# Patient Record
Sex: Male | Born: 1980 | Race: Black or African American | Hispanic: No | Marital: Single | Smoking: Never smoker
Health system: Southern US, Community
[De-identification: ages and names within clinical notes are randomized; demographics above are authoritative.]

## PROBLEM LIST (undated history)

## (undated) DIAGNOSIS — Z789 Other specified health status: Secondary | ICD-10-CM

## (undated) HISTORY — DX: Other specified health status: Z78.9

## (undated) HISTORY — PX: NO PAST SURGERIES: SHX2092

---

## 2019-02-11 ENCOUNTER — Emergency Department (HOSPITAL_COMMUNITY): Payer: PRIVATE HEALTH INSURANCE

## 2019-02-11 ENCOUNTER — Emergency Department (HOSPITAL_COMMUNITY)
Admission: EM | Admit: 2019-02-11 | Discharge: 2019-02-11 | Disposition: A | Discharge status: Home / Self Care | Payer: PRIVATE HEALTH INSURANCE | Attending: Emergency Medicine | Admitting: Emergency Medicine

## 2019-02-11 ENCOUNTER — Encounter (HOSPITAL_COMMUNITY): Payer: Self-pay | Admitting: Emergency Medicine

## 2019-02-11 ENCOUNTER — Other Ambulatory Visit: Payer: Self-pay

## 2019-02-11 DIAGNOSIS — I10 Essential (primary) hypertension: Secondary | ICD-10-CM | POA: Insufficient documentation

## 2019-02-11 DIAGNOSIS — K219 Gastro-esophageal reflux disease without esophagitis: Secondary | ICD-10-CM | POA: Insufficient documentation

## 2019-02-11 DIAGNOSIS — F419 Anxiety disorder, unspecified: Secondary | ICD-10-CM | POA: Insufficient documentation

## 2019-02-11 LAB — COMPREHENSIVE METABOLIC PANEL
ALT: 25 U/L (ref 0–44)
AST: 26 U/L (ref 15–41)
Albumin: 4.5 g/dL (ref 3.5–5.0)
Alkaline Phosphatase: 74 U/L (ref 38–126)
Anion gap: 12 (ref 5–15)
BUN: 9 mg/dL (ref 6–20)
CO2: 23 mmol/L (ref 22–32)
Calcium: 9.8 mg/dL (ref 8.9–10.3)
Chloride: 106 mmol/L (ref 98–111)
Creatinine, Ser: 1.1 mg/dL (ref 0.61–1.24)
GFR calc Af Amer: >60 mL/min (ref >60)
GFR calc non Af Amer: >60 mL/min (ref >60)
Glucose, Bld: 100 mg/dL — ABNORMAL HIGH (ref 70–99)
Potassium: 3.5 mmol/L (ref 3.5–5.1)
Sodium: 141 mmol/L (ref 135–145)
Total Bilirubin: 0.9 mg/dL (ref 0.3–1.2)
Total Protein: 7.8 g/dL (ref 6.5–8.1)

## 2019-02-11 LAB — CBC
HCT: 47 % (ref 39.0–52.0)
Hemoglobin: 16 g/dL (ref 13.0–17.0)
MCH: 31.5 pg (ref 26.0–34.0)
MCHC: 34 g/dL (ref 30.0–36.0)
MCV: 92.5 fL (ref 80.0–100.0)
Platelets: 279 10*3/uL (ref 150–400)
RBC: 5.08 MIL/uL (ref 4.22–5.81)
RDW: 11.4 % — ABNORMAL LOW (ref 11.5–15.5)
WBC: 5.5 10*3/uL (ref 4.0–10.5)
nRBC: 0 % (ref 0.0–0.2)

## 2019-02-11 LAB — TROPONIN I: Troponin I: <0.03 ng/mL (ref <0.03)

## 2019-02-11 LAB — T4, FREE: Free T4: 0.79 ng/dL — ABNORMAL LOW (ref 0.82–1.77)

## 2019-02-11 LAB — TSH: TSH: 0.76 u[IU]/mL (ref 0.350–4.500)

## 2019-02-11 MED ORDER — OMEPRAZOLE 20 MG PO CPDR: 20.0000 mg | DELAYED_RELEASE_CAPSULE | Freq: Every day | ORAL | 0 refills | Status: DC

## 2019-02-11 MED ORDER — ALUM & MAG HYDROXIDE-SIMETH 200-200-20 MG/5ML PO SUSP: 30.0000 mL | Freq: Once | ORAL | Status: DC

## 2019-02-11 NOTE — ED Notes (Signed)
Patient verbalizes understanding of discharge instructions. Opportunity for questioning and answers were provided. Armband removed by staff, pt discharged from ED.  

## 2019-02-11 NOTE — ED Triage Notes (Signed)
p stated, its a real tightness and Im really scared.

## 2019-02-11 NOTE — ED Triage Notes (Signed)
Pt. At sort in tears and stated, Im not sure if its anxiety, acid reflux . I know I ate a taco from a truck and I had a type of indigestion and bubbling in my stomach and going up in my chest.

## 2019-02-11 NOTE — ED Provider Notes (Signed)
[facility] EMERGENCY DEPARTMENT Provider Note   CSN: [id number] Arrival date & time: 02/11/19  1258    History   Chief Complaint Chief Complaint  Patient presents with  . Anxiety  . Gastroesophageal Reflux    HPI Mario Li is a 38 y.o. male.     HPI Pt ate a taco on Friday.   After that he started feeling a tightness in his throat.  He also started burping a lot.   No CP or abd pain.  Pt has been able to drink fluids.  No difficulty swallowing.   He is very anxious about this.   Patient is concerned because he is not sure what is going on.  He is never had this problem before.  He is worried that it could be something like covid or his thyroid. History reviewed. No pertinent past medical history.  There are no active problems to display for this patient.   History reviewed. No pertinent surgical history.      Home Medications    Prior to Admission medications   Medication Sig Start Date End Date Taking? Authorizing Provider  omeprazole (PRILOSEC) 20 MG capsule Take 1 capsule (20 mg total) by mouth daily. 02/11/19   Linwood Dibbles, MD    Family History No family history on file.  Social History Social History   Tobacco Use  . Smoking status: Never Smoker  . Smokeless tobacco: Never Used  Substance Use Topics  . Alcohol use: Not Currently  . Drug use: Yes    Types: Marijuana     Allergies   Patient has no allergy information on record.   Review of Systems Review of Systems  All other systems reviewed and are negative.    Physical Exam Updated Vital Signs BP (!) 168/103   Pulse 87   Temp 98.2 F (36.8 C) (Oral)   Resp (!) 22   Ht 1.676 m (5\' 6" )   Wt 89.8 kg   SpO2 97%   BMI 31.96 kg/m   Physical Exam Vitals signs and nursing note reviewed.  Constitutional:      General: He is not in acute distress.    Appearance: He is well-developed.  HENT:     Head: Normocephalic and atraumatic.     Right Ear: External ear  normal.     Left Ear: External ear normal.  Eyes:     General: No scleral icterus.       Right eye: No discharge.        Left eye: No discharge.     Conjunctiva/sclera: Conjunctivae normal.  Neck:     Musculoskeletal: Neck supple.     Trachea: No tracheal deviation.  Cardiovascular:     Rate and Rhythm: Normal rate and regular rhythm.  Pulmonary:     Effort: Pulmonary effort is normal. No respiratory distress.     Breath sounds: Normal breath sounds. No stridor. No wheezing or rales.  Abdominal:     General: Bowel sounds are normal. There is no distension.     Palpations: Abdomen is soft.     Tenderness: There is no abdominal tenderness. There is no guarding or rebound.  Musculoskeletal:        General: No tenderness.  Skin:    General: Skin is warm and dry.     Findings: No rash.  Neurological:     Mental Status: He is alert.     Cranial Nerves: No cranial nerve deficit (no facial droop, extraocular movements intact,  no slurred speech).     Sensory: No sensory deficit.     Motor: No abnormal muscle tone or seizure activity.     Coordination: Coordination normal.      ED Treatments / Results  Labs (all labs ordered are listed, but only abnormal results are displayed) Labs Reviewed  CBC - Abnormal; Notable for the following components:      Result Value   RDW 11.4 (*)    All other components within normal limits  COMPREHENSIVE METABOLIC PANEL - Abnormal; Notable for the following components:   Glucose, Bld 100 (*)    All other components within normal limits  T4, FREE - Abnormal; Notable for the following components:   Free T4 0.79 (*)    All other components within normal limits  TROPONIN I  TSH    EKG EKG Interpretation  Date/Time:  Saturday Feb 11 2019 14:01:00 EDT Ventricular Rate:  83 PR Interval:    QRS Duration: 88 QT Interval:  362 QTC Calculation: 426 R Axis:   66 Text Interpretation:  Sinus rhythm Borderline T wave abnormalities No old tracing to  compare Confirmed by Linwood Dibbles [phone]) on 02/11/2019 2:14:41 PM Also confirmed by Linwood Dibbles [phone]), editor Barbette Hair [phone])  on 02/11/2019 2:36:02 PM   Radiology Dg Chest Portable 1 View  Result Date: 02/11/2019 CLINICAL DATA:  Throat discomfort. EXAM: PORTABLE CHEST 1 VIEW COMPARISON:  None. FINDINGS: The heart size and mediastinal contours are within normal limits. Both lungs are clear. The visualized skeletal structures are unremarkable. IMPRESSION: No active disease. Electronically Signed   By: Gerome Sam III M.D   On: 02/11/2019 14:06    Procedures Procedures (including critical care time)  Medications Ordered in ED Medications  alum & mag hydroxide-simeth (MAALOX/MYLANTA) 200-200-20 MG/5ML suspension 30 mL (has no administration in time range)     Initial Impression / Assessment and Plan / ED Course  I have reviewed the triage vital signs and the nursing notes.  Pertinent labs & imaging results that were available during my care of the patient were reviewed by me and considered in my medical decision making (see chart for details).  Clinical Course as of Feb 11 1527  Sat Feb 11, 2019  1500 T4 is slightly lower than normal.  TSH is normal.  Labs otherwise normal   [JK]  1525 BP 140/90 at the bedside   [JK]    Clinical Course User Index [JK] Linwood Dibbles, MD     Patient's ED work-up is reassuring.  Laboratory tests are unremarkable.  EKG is reassuring.  No findings to suggest cardiac etiology.  Patient does not have any difficulty handling his secretions.  I doubt esophageal impaction.  Thyroid tests are unremarkable.  I suspect the patient is having issues with gastroesophageal reflux.  This is likely giving him this globus hystericus sensation.  He is having a component and anxiety but I think this was triggered by his esophagitis.  Plan on discharge home with antacids.  Recommend following up with primary care doctor to recheck his blood pressure which is most likely  secondary to his discomfort and to see how his presumed esophagitis is responding to treatment.  Final Clinical Impressions(s) / ED Diagnoses   Final diagnoses:  Gastroesophageal reflux disease, esophagitis presence not specified  Anxiety  Hypertension, unspecified type    ED Discharge Orders         Ordered    omeprazole (PRILOSEC) 20 MG capsule  Daily  02/11/19 1522           Linwood DibblesKnapp, Zori Benbrook, MD 02/11/19 1530

## 2019-02-11 NOTE — Discharge Instructions (Signed)
Take the antacid medications as prescribed, follow-up with your primary care doctor to be rechecked next week.  Return as needed for worsening symptoms.

## 2019-02-11 NOTE — ED Notes (Signed)
Pt has been in contact with family. 

## 2019-02-21 NOTE — Progress Notes (Signed)
Patient ID: Mario Li, male   DOB: [birth year], 38 y.o.   MRN: [id number]  Virtual Visit via Telephone Note  I connected with Mario Li on 02/22/19 at  1:50 PM EDT by telephone and verified that I am speaking with the correct person using two identifiers.   I discussed the limitations, risks, security and privacy concerns of performing an evaluation and management service by telephone and the availability of in person appointments. I also discussed with the patient that there may be a patient responsible charge related to this service. The patient expressed understanding and agreed to proceed.  Patient location:  home My Location:  [facility] office Persons on the call:  Myself and the patient   History of Present Illness: After being seen in the ED 02/11/2019 for globus/panic sensation  Cardiac w/up benign.   Believed to be reflux and treated with Omeprazole.  Patient does have anxiety in large crowds.  Omeprazole upset his stomach, so, he isn't taking it.  He has never been on medication for anxiety but feels he has always had some issues with anxiety on and off.  Denies depression.  No SI/HI.  Checking bp at home.  116/81.  It was high in the ED.    From ED A/P: Patient's ED work-up is reassuring.  Laboratory tests are unremarkable.  EKG is reassuring.  No findings to suggest cardiac etiology.  Patient does not have any difficulty handling his secretions.  I doubt esophageal impaction.  Thyroid tests are unremarkable.  I suspect the patient is having issues with gastroesophageal reflux.  This is likely giving him this globus hystericus sensation.  He is having a component and anxiety but I think this was triggered by his esophagitis.  Plan on discharge home with antacids.  Recommend following up with primary care doctor to recheck his blood pressure which is most likely secondary to his discomfort and to see how his presumed esophagitis is responding to treatment.    Observations/Objective:  A&Ox3.  NAD.  TP linear.  Speech is clear.    Assessment and Plan: 1. Gastroesophageal reflux disease without esophagitis GERD prevention reviewed  2. Elevated BP without diagnosis of hypertension Normotensive at home.  Continue to check BP at home - Lipid panel; Future  3. Encounter for examination following treatment at hospital Much improved.  Symptoms resolved currently  4. Anxiety -consider SSRI or buspar.  He wishes to defer for now.  Deep breathing techniques, self-care, etc discussed at length -will have Jasmine f/up with him - Vitamin D, 25-hydroxy; Future    Follow Up Instructions: Labs Friday Assign PCP in 1 month Jasmine to follow- up.    I discussed the assessment and treatment plan with the patient. The patient was provided an opportunity to ask questions and all were answered. The patient agreed with the plan and demonstrated an understanding of the instructions.   The patient was advised to call back or seek an in-person evaluation if the symptoms worsen or if the condition fails to improve as anticipated.  I provided 24 minutes of non-face-to-face time during this encounter.   Georgian Co, PA-C

## 2019-02-22 ENCOUNTER — Ambulatory Visit: Payer: No Typology Code available for payment source | Attending: Family Medicine | Admitting: Physician Assistant

## 2019-02-22 ENCOUNTER — Other Ambulatory Visit: Payer: Self-pay

## 2019-02-22 DIAGNOSIS — K219 Gastro-esophageal reflux disease without esophagitis: Secondary | ICD-10-CM

## 2019-02-22 DIAGNOSIS — R030 Elevated blood-pressure reading, without diagnosis of hypertension: Secondary | ICD-10-CM

## 2019-02-22 DIAGNOSIS — F419 Anxiety disorder, unspecified: Secondary | ICD-10-CM

## 2019-02-22 DIAGNOSIS — Z09 Encounter for follow-up examination after completed treatment for conditions other than malignant neoplasm: Secondary | ICD-10-CM

## 2019-02-24 ENCOUNTER — Ambulatory Visit: Payer: No Typology Code available for payment source | Attending: Family Medicine

## 2019-02-24 ENCOUNTER — Other Ambulatory Visit: Payer: Self-pay

## 2019-02-24 DIAGNOSIS — F419 Anxiety disorder, unspecified: Secondary | ICD-10-CM

## 2019-02-24 DIAGNOSIS — R030 Elevated blood-pressure reading, without diagnosis of hypertension: Secondary | ICD-10-CM

## 2019-02-25 LAB — LIPID PANEL
Chol/HDL Ratio: 3 ratio (ref 0.0–5.0)
Cholesterol, Total: 133 mg/dL (ref 100–199)
HDL: 45 mg/dL (ref >39)
LDL Calculated: 73 mg/dL (ref 0–99)
Triglycerides: 76 mg/dL (ref 0–149)
VLDL Cholesterol Cal: 15 mg/dL (ref 5–40)

## 2019-02-25 LAB — VITAMIN D 25 HYDROXY (VIT D DEFICIENCY, FRACTURES): Vit D, 25-Hydroxy: 20.7 ng/mL — ABNORMAL LOW (ref 30.0–100.0)

## 2019-02-26 ENCOUNTER — Other Ambulatory Visit: Payer: Self-pay | Admitting: Physician Assistant

## 2019-02-26 MED ORDER — VITAMIN D (ERGOCALCIFEROL) 1.25 MG (50000 UNIT) PO CAPS: 50000.0000 [IU] | ORAL_CAPSULE | ORAL | 0 refills | Status: DC

## 2019-02-27 ENCOUNTER — Telehealth: Payer: Self-pay | Admitting: Emergency Medicine

## 2019-02-27 NOTE — Telephone Encounter (Signed)
Patient contacted via phone to be given results of labs.  Patient identified by name and date of birth.  Patient given results of labs.  Patient educated on lab results. Questions answered. Patient acknowledged understanding of labs results. 

## 2019-03-09 ENCOUNTER — Other Ambulatory Visit: Payer: Self-pay

## 2019-03-09 ENCOUNTER — Encounter (HOSPITAL_COMMUNITY): Payer: Self-pay | Admitting: Emergency Medicine

## 2019-03-09 ENCOUNTER — Emergency Department (HOSPITAL_COMMUNITY)
Admission: EM | Admit: 2019-03-09 | Discharge: 2019-03-09 | Disposition: A | Discharge status: Home / Self Care | Payer: PRIVATE HEALTH INSURANCE | Attending: Emergency Medicine | Admitting: Emergency Medicine

## 2019-03-09 ENCOUNTER — Telehealth: Payer: No Typology Code available for payment source

## 2019-03-09 ENCOUNTER — Emergency Department (HOSPITAL_COMMUNITY): Payer: PRIVATE HEALTH INSURANCE

## 2019-03-09 DIAGNOSIS — R002 Palpitations: Secondary | ICD-10-CM

## 2019-03-09 DIAGNOSIS — F1290 Cannabis use, unspecified, uncomplicated: Secondary | ICD-10-CM | POA: Insufficient documentation

## 2019-03-09 DIAGNOSIS — R072 Precordial pain: Secondary | ICD-10-CM | POA: Insufficient documentation

## 2019-03-09 DIAGNOSIS — R0789 Other chest pain: Secondary | ICD-10-CM | POA: Diagnosis present

## 2019-03-09 LAB — BASIC METABOLIC PANEL
Anion gap: 12 (ref 5–15)
BUN: 10 mg/dL (ref 6–20)
CO2: 20 mmol/L — ABNORMAL LOW (ref 22–32)
Calcium: 9.6 mg/dL (ref 8.9–10.3)
Chloride: 106 mmol/L (ref 98–111)
Creatinine, Ser: 1.09 mg/dL (ref 0.61–1.24)
GFR calc Af Amer: >60 mL/min (ref >60)
GFR calc non Af Amer: >60 mL/min (ref >60)
Glucose, Bld: 106 mg/dL — ABNORMAL HIGH (ref 70–99)
Potassium: 3.5 mmol/L (ref 3.5–5.1)
Sodium: 138 mmol/L (ref 135–145)

## 2019-03-09 LAB — CBC
HCT: 48.1 % (ref 39.0–52.0)
Hemoglobin: 16.3 g/dL (ref 13.0–17.0)
MCH: 31.7 pg (ref 26.0–34.0)
MCHC: 33.9 g/dL (ref 30.0–36.0)
MCV: 93.6 fL (ref 80.0–100.0)
Platelets: 294 10*3/uL (ref 150–400)
RBC: 5.14 MIL/uL (ref 4.22–5.81)
RDW: 11.3 % — ABNORMAL LOW (ref 11.5–15.5)
WBC: 6 10*3/uL (ref 4.0–10.5)
nRBC: 0 % (ref 0.0–0.2)

## 2019-03-09 LAB — I-STAT TROPONIN, ED: Troponin i, poc: 0.01 ng/mL (ref 0.00–0.08)

## 2019-03-09 LAB — TROPONIN I: Troponin I: <0.03 ng/mL (ref <0.03)

## 2019-03-09 MED ORDER — FAMOTIDINE 20 MG PO TABS
20.0000 mg | ORAL_TABLET | Freq: Once | ORAL | Status: AC
  Administered 2019-03-09: 20 mg via ORAL
  Filled 2019-03-09: qty 1

## 2019-03-09 MED ORDER — ACETAMINOPHEN 500 MG PO TABS
1000.0000 mg | ORAL_TABLET | Freq: Once | ORAL | Status: AC
  Administered 2019-03-09: 15:00:00 1000 mg via ORAL
  Filled 2019-03-09: qty 2

## 2019-03-09 MED ORDER — ALUM & MAG HYDROXIDE-SIMETH 200-200-20 MG/5ML PO SUSP
30.0000 mL | Freq: Once | ORAL | Status: AC
  Administered 2019-03-09: 30 mL via ORAL
  Filled 2019-03-09: qty 30

## 2019-03-09 NOTE — ED Notes (Signed)
Patient transported to X-ray 

## 2019-03-09 NOTE — Discharge Instructions (Addendum)
It was our pleasure to provide your ER care today - we hope that you feel better.  For recent chest discomfort/palpitations, follow up with cardiologist in the next 1-2 weeks - call office to arrange appointment.   Return to ER if worse, new symptoms, fevers, recurrent or persistent chest pain, trouble breathing, persistent fast heartbeat, fainting, other concern.

## 2019-03-09 NOTE — ED Provider Notes (Signed)
[facility] Provider [facility]   CSN: [id number] Arrival date & time: 03/09/19  1224     History   Chief Complaint Chief Complaint  Patient presents with  . Chest Pain    HPI Mario Li is a 38 y.o. male.     Patient c/o chest pain for the past 2 days. Symptoms acute onset, at rest, constant/persistent, non radiating - states feels as if heart may be beating fast, and at other times notes a sense of gas along lower sternum area. No relation to position or activity or exertion. Denies associated sob, nv or diaphoresis. No pleuritic pain. No syncope. Denies fam hx premature cad, father had heart disease around age 40. Denies hx dysrhythmia. No leg pain or swelling. No recent surgery, immobility, trauma or travel. No cough or uri symptoms. No fever or chills. No abd pain or nv.   The history is provided by the patient.  Chest Pain Associated symptoms: palpitations   Associated symptoms: no abdominal pain, no back pain, no cough, no fever, no headache, no shortness of breath and no vomiting     History reviewed. No pertinent past medical history.  There are no active problems to display for this patient.   History reviewed. No pertinent surgical history.      Home Medications    Prior to Admission medications   Medication Sig Start Date End Date Taking? Authorizing Provider  Vitamin D, Ergocalciferol, (DRISDOL) 1.25 MG (50000 UT) CAPS capsule Take 1 capsule (50,000 Units total) by mouth every 7 (seven) days. 02/26/19   Anders SimmondsMcClung, Angela M, PA-C    Family History History reviewed. No pertinent family history.  Social History Social History   Tobacco Use  . Smoking status: Never Smoker  . Smokeless tobacco: Never Used  Substance Use Topics  . Alcohol use: Not Currently  . Drug use: Yes    Types: Marijuana    Comment: occ     Allergies   Patient has no known allergies.   Review of Systems Review of Systems   Constitutional: Negative for fever.  HENT: Negative for sore throat.   Eyes: Negative for redness.  Respiratory: Negative for cough and shortness of breath.   Cardiovascular: Positive for chest pain and palpitations. Negative for leg swelling.  Gastrointestinal: Negative for abdominal pain, diarrhea and vomiting.  Genitourinary: Negative for flank pain.  Musculoskeletal: Negative for back pain and neck pain.  Skin: Negative for rash.  Neurological: Negative for headaches.  Hematological: Does not bruise/bleed easily.  Psychiatric/Behavioral: Negative for confusion.     Physical Exam Updated Vital Signs BP 121/87   Pulse 74   Temp 98.6 F (37 C) (Oral)   Resp 17   Ht 1.676 m (5\' 6" )   Wt 89.4 kg   SpO2 100%   BMI 31.80 kg/m   Physical Exam Vitals signs and nursing note reviewed.  Constitutional:      Appearance: Normal appearance. He is well-developed.  HENT:     Head: Atraumatic.     Nose: Nose normal.     Mouth/Throat:     Mouth: Mucous membranes are moist.     Pharynx: Oropharynx is clear.  Eyes:     General: No scleral icterus.    Conjunctiva/sclera: Conjunctivae normal.  Neck:     Musculoskeletal: Normal range of motion and neck supple. No neck rigidity.     Trachea: No tracheal deviation.  Cardiovascular:     Rate and Rhythm: Normal rate and regular  rhythm.     Pulses: Normal pulses.     Heart sounds: Normal heart sounds. No murmur. No friction rub. No gallop.   Pulmonary:     Effort: Pulmonary effort is normal. No accessory muscle usage or respiratory distress.     Breath sounds: Normal breath sounds.  Chest:     Chest wall: No tenderness.  Abdominal:     General: Bowel sounds are normal. There is no distension.     Palpations: Abdomen is soft. There is no mass.     Tenderness: There is no abdominal tenderness. There is no guarding or rebound.     Hernia: No hernia is present.  Genitourinary:    Comments: No cva tenderness. Musculoskeletal:         General: No swelling or tenderness.     Right lower leg: No edema.     Left lower leg: No edema.  Skin:    General: Skin is warm and dry.     Findings: No rash.  Neurological:     Mental Status: He is alert.     Comments: Alert, speech clear.   Psychiatric:        Mood and Affect: Mood normal.      ED Treatments / Results  Labs (all labs ordered are listed, but only abnormal results are displayed) Results for orders placed or performed during the hospital encounter of 03/09/19  Basic metabolic panel  Result Value Ref Range   Sodium 138 135 - 145 mmol/L   Potassium 3.5 3.5 - 5.1 mmol/L   Chloride 106 98 - 111 mmol/L   CO2 20 (L) 22 - 32 mmol/L   Glucose, Bld 106 (H) 70 - 99 mg/dL   BUN 10 6 - 20 mg/dL   Creatinine, Ser 1.611.09 0.61 - 1.24 mg/dL   Calcium 9.6 8.9 - 09.610.3 mg/dL   GFR calc non Af Amer >60 >60 mL/min   GFR calc Af Amer >60 >60 mL/min   Anion gap 12 5 - 15  CBC  Result Value Ref Range   WBC 6.0 4.0 - 10.5 K/uL   RBC 5.14 4.22 - 5.81 MIL/uL   Hemoglobin 16.3 13.0 - 17.0 g/dL   HCT 04.548.1 40.939.0 - 81.152.0 %   MCV 93.6 80.0 - 100.0 fL   MCH 31.7 26.0 - 34.0 pg   MCHC 33.9 30.0 - 36.0 g/dL   RDW 91.411.3 (L) 78.211.5 - 95.615.5 %   Platelets 294 150 - 400 K/uL   nRBC 0.0 0.0 - 0.2 %  Troponin I - ONCE - STAT  Result Value Ref Range   Troponin I <0.03 <0.03 ng/mL   Dg Chest 2 View  Result Date: 03/09/2019 CLINICAL DATA:  RIGHT-sided chest pain EXAM: CHEST - 2 VIEW COMPARISON:  02/11/2019 FINDINGS: Normal mediastinum and cardiac silhouette. Normal pulmonary vasculature. No evidence of effusion, infiltrate, or pneumothorax. No acute bony abnormality. IMPRESSION: No acute cardiopulmonary process. Electronically Signed   By: Genevive BiStewart  Edmunds M.D.   On: 03/09/2019 13:14   Dg Chest Portable 1 View  Result Date: 02/11/2019 CLINICAL DATA:  Throat discomfort. EXAM: PORTABLE CHEST 1 VIEW COMPARISON:  None. FINDINGS: The heart size and mediastinal contours are within normal limits. Both  lungs are clear. The visualized skeletal structures are unremarkable. IMPRESSION: No active disease. Electronically Signed   By: Gerome Samavid  Williams III M.D   On: 02/11/2019 14:06    EKG EKG Interpretation  Date/Time:  Thursday March 09 2019 12:33:57 EDT Ventricular Rate:  90 PR  Interval:    QRS Duration: 85 QT Interval:  358 QTC Calculation: 438 R Axis:   59 Text Interpretation:  Sinus rhythm Nonspecific T wave abnormality No significant change since last tracing Confirmed by Lajean Saver [phone]) on 03/09/2019 2:01:01 PM   Radiology Dg Chest 2 View  Result Date: 03/09/2019 CLINICAL DATA:  RIGHT-sided chest pain EXAM: CHEST - 2 VIEW COMPARISON:  02/11/2019 FINDINGS: Normal mediastinum and cardiac silhouette. Normal pulmonary vasculature. No evidence of effusion, infiltrate, or pneumothorax. No acute bony abnormality. IMPRESSION: No acute cardiopulmonary process. Electronically Signed   By: Suzy Bouchard M.D.   On: 03/09/2019 13:14    Procedures Procedures (including critical care time)  Medications Ordered in ED Medications  famotidine (PEPCID) tablet 20 mg (has no administration in time range)  alum & mag hydroxide-simeth (MAALOX/MYLANTA) 200-200-20 MG/5ML suspension 30 mL (has no administration in time range)  acetaminophen (TYLENOL) tablet 1,000 mg (has no administration in time range)     Initial Impression / Assessment and Plan / ED Course  I have reviewed the triage vital signs and the nursing notes.  Pertinent labs & imaging results that were available during my care of the patient were reviewed by me and considered in my medical decision making (see chart for details).  Iv ns. Labs. Cxr. Ecg.   Reviewed nursing notes and prior charts for additional history.   Will try pepcid, maalox, and acetaminophen for symptom relief.   Labs reviewed by me - troponin is normal.  CXR reviewed by me - no pna.   1600 delta troponin pending. Pt comfortable, nad.   Signed out to  Dr Rex Kras to check delta trop - if normal, plan for d/c with outpt cardiology f/u.     Final Clinical Impressions(s) / ED Diagnoses   Final diagnoses:  None    ED Discharge Orders    None       Lajean Saver, MD 03/09/19 1606

## 2019-03-09 NOTE — ED Provider Notes (Signed)
I received this patient in signout from Dr. Ashok Cordia. He presented with atypical chest pain and palpitation symptoms and his work-up thus far including initial troponin, screening labs, and chest x-ray was reassuring.  EKG nonischemic.  We were awaiting second troponin.  Second troponin negative.  Patient well-appearing on reassessment.  I have discussed outpatient follow-up and extensively reviewed return precautions.  He voiced understanding.   Little, Wenda Overland, MD 03/09/19 1818

## 2019-03-09 NOTE — ED Triage Notes (Signed)
Pt reports intermittent central CP since last night that feels like "palpitations".

## 2019-03-10 ENCOUNTER — Telehealth: Payer: Self-pay | Admitting: General Practice

## 2019-03-10 NOTE — Telephone Encounter (Signed)
Patients call returned.  Patient identified by name and date of birth.  Patient states he smoked marijuana that he believed was laced with something.  Patient was having intermittent chest pain.  Patient denied shortness of breathe or pain radiation.  Patient was anxious when speaking.  Patient went to ED and ED rwport was reviewed with patient and he was informed that results show no eveidence of a cardiac event.    Patient advised that he needed to detox his body with plenty of water and a diet devoid of fried food and fat.  Patient advised to eat greens and vegetables.  Patient advised that effects could last a couple of days.  Patient advised that if symptoms did not improve then they should go to the Emergency Department or Urgent Care.  Patient acknowledged understanding of advice.

## 2019-03-10 NOTE — Telephone Encounter (Signed)
Please triage to triage nurse.

## 2019-03-10 NOTE — Telephone Encounter (Signed)
New Message   Pt states that he is having some pain in chest

## 2019-04-24 ENCOUNTER — Ambulatory Visit: Payer: No Typology Code available for payment source | Attending: Nurse Practitioner | Admitting: Nurse Practitioner

## 2019-04-24 ENCOUNTER — Other Ambulatory Visit: Payer: Self-pay

## 2019-05-18 ENCOUNTER — Other Ambulatory Visit: Payer: Self-pay

## 2019-05-18 ENCOUNTER — Emergency Department (HOSPITAL_COMMUNITY)
Admission: EM | Admit: 2019-05-18 | Discharge: 2019-05-18 | Disposition: A | Discharge status: Home / Self Care | Payer: PRIVATE HEALTH INSURANCE | Attending: Emergency Medicine | Admitting: Emergency Medicine

## 2019-05-18 ENCOUNTER — Encounter (HOSPITAL_COMMUNITY): Payer: Self-pay | Admitting: Emergency Medicine

## 2019-05-18 ENCOUNTER — Emergency Department (HOSPITAL_COMMUNITY): Payer: PRIVATE HEALTH INSURANCE

## 2019-05-18 DIAGNOSIS — R0789 Other chest pain: Secondary | ICD-10-CM | POA: Diagnosis present

## 2019-05-18 DIAGNOSIS — R079 Chest pain, unspecified: Secondary | ICD-10-CM

## 2019-05-18 DIAGNOSIS — Z79899 Other long term (current) drug therapy: Secondary | ICD-10-CM | POA: Insufficient documentation

## 2019-05-18 LAB — CBC
HCT: 47 % (ref 39.0–52.0)
Hemoglobin: 15.8 g/dL (ref 13.0–17.0)
MCH: 31.8 pg (ref 26.0–34.0)
MCHC: 33.6 g/dL (ref 30.0–36.0)
MCV: 94.6 fL (ref 80.0–100.0)
Platelets: 290 10*3/uL (ref 150–400)
RBC: 4.97 MIL/uL (ref 4.22–5.81)
RDW: 11.1 % — ABNORMAL LOW (ref 11.5–15.5)
WBC: 5.4 10*3/uL (ref 4.0–10.5)
nRBC: 0 % (ref 0.0–0.2)

## 2019-05-18 LAB — BASIC METABOLIC PANEL
Anion gap: 13 (ref 5–15)
BUN: 6 mg/dL (ref 6–20)
CO2: 22 mmol/L (ref 22–32)
Calcium: 9.5 mg/dL (ref 8.9–10.3)
Chloride: 104 mmol/L (ref 98–111)
Creatinine, Ser: 1.16 mg/dL (ref 0.61–1.24)
GFR calc Af Amer: >60 mL/min (ref >60)
GFR calc non Af Amer: >60 mL/min (ref >60)
Glucose, Bld: 90 mg/dL (ref 70–99)
Potassium: 3.6 mmol/L (ref 3.5–5.1)
Sodium: 139 mmol/L (ref 135–145)

## 2019-05-18 LAB — TROPONIN I (HIGH SENSITIVITY)
Troponin I (High Sensitivity): 3 ng/L (ref <18)
Troponin I (High Sensitivity): <2 ng/L (ref <18)

## 2019-05-18 MED ORDER — SODIUM CHLORIDE 0.9% FLUSH: 3.0000 mL | Freq: Once | INTRAVENOUS | Status: DC

## 2019-05-18 NOTE — ED Provider Notes (Signed)
[facility] Provider Note   CSN: [id number] Arrival date & time: 05/18/19  1433     History   Chief Complaint Chief Complaint  Patient presents with  . Chest Pain    HPI Mario Li is a 38 y.o. male.     The history is provided by the patient and medical records. No language interpreter was used.  Chest Pain Associated symptoms: no palpitations    Mario Li is an otherwise healthy 38 y.o. male who presents to the Emergency Department complaining of intermittent, sharp central chest pain over the last 4 to 5 days.  Occasionally will have a burning feeling, while other times feels like his muscles are all getting tight. States that his symptoms will last about an hour and then resolve.  This afternoon, he states that he had a sharp pain down his left arm and into his left toes.  This lasted a few minutes then resolved.  He has been waiting in the emergency department about 6 and half hours upon my initial evaluation.  He states that he has felt well for the last several hours without any symptoms.  No history of hypertension, hyperlipidemia, diabetes or heart disease.  Denies family cardiac hx. no lower extremity swelling or pain.  No recent travel/surgeries/immobilization.  History reviewed. No pertinent past medical history.  There are no active problems to display for this patient.   History reviewed. No pertinent surgical history.      Home Medications    Prior to Admission medications   Medication Sig Start Date End Date Taking? Authorizing Provider  omeprazole (PRILOSEC) 20 MG capsule Take 20 mg by mouth daily.    [provider]  Vitamin D, Ergocalciferol, (DRISDOL) 1.25 MG (50000 UT) CAPS capsule Take 1 capsule (50,000 Units total) by mouth every 7 (seven) days. Patient taking differently: Take 50,000 Units by mouth every Monday.  02/26/19   Anders SimmondsMcClung, Angela M, PA-C    Family History No family history on  file.  Social History Social History   Tobacco Use  . Smoking status: Never Smoker  . Smokeless tobacco: Never Used  Substance Use Topics  . Alcohol use: Not Currently  . Drug use: Yes    Types: Marijuana    Comment: occ     Allergies   Patient has no known allergies.   Review of Systems Review of Systems  Cardiovascular: Positive for chest pain. Negative for palpitations and leg swelling.  All other systems reviewed and are negative.    Physical Exam Updated Vital Signs BP 131/85   Pulse 78   Temp 98.7 F (37.1 C) (Oral)   Resp 17   Ht 5\' 6"  (1.676 m)   Wt 86.2 kg   SpO2 100%   BMI 30.67 kg/m   Physical Exam Vitals signs and nursing note reviewed.  Constitutional:      General: He is not in acute distress.    Appearance: He is well-developed.     Comments: Well-appearing.  HENT:     Head: Normocephalic and atraumatic.  Neck:     Musculoskeletal: Neck supple.  Cardiovascular:     Rate and Rhythm: Normal rate and regular rhythm.     Heart sounds: Normal heart sounds. No murmur.     Comments: Intact and equal pulses x4. Pulmonary:     Effort: Pulmonary effort is normal. No respiratory distress.     Breath sounds: Normal breath sounds.  Abdominal:     General: There  is no distension.     Palpations: Abdomen is soft.     Tenderness: There is no abdominal tenderness.  Musculoskeletal:        General: No swelling or tenderness.     Right lower leg: No edema.     Left lower leg: No edema.  Skin:    General: Skin is warm and dry.  Neurological:     Mental Status: He is alert and oriented to person, place, and time.     Comments: All 4 extremities neurovascularly intact.      ED Treatments / Results  Labs (all labs ordered are listed, but only abnormal results are displayed) Labs Reviewed  CBC - Abnormal; Notable for the following components:      Result Value   RDW 11.1 (*)    All other components within normal limits  BASIC METABOLIC PANEL   TROPONIN I (HIGH SENSITIVITY)  TROPONIN I (HIGH SENSITIVITY)    EKG EKG Interpretation  Date/Time:  Thursday May 18 2019 14:42:32 EDT Ventricular Rate:  95 PR Interval:  152 QRS Duration: 82 QT Interval:  366 QTC Calculation: 459 R Axis:   49 Text Interpretation:  Normal sinus rhythm Nonspecific T wave abnormality Abnormal ECG No significant change since last tracing Confirmed by Fredia Sorrow [phone]) on 05/18/2019 9:16:52 PM   Radiology Dg Chest 2 View  Result Date: 05/18/2019 CLINICAL DATA:  Acute LEFT chest pain for 1 day. EXAM: CHEST - 2 VIEW COMPARISON:  05/09/2019 FINDINGS: The cardiomediastinal silhouette is unremarkable. There is no evidence of focal airspace disease, pulmonary edema, suspicious pulmonary nodule/mass, pleural effusion, or pneumothorax. No acute bony abnormalities are identified. IMPRESSION: No active cardiopulmonary disease. Electronically Signed   By: Margarette Canada M.D.   On: 05/18/2019 15:13    Procedures Procedures (including critical care time)  Medications Ordered in ED Medications  sodium chloride flush (NS) 0.9 % injection 3 mL (has no administration in time range)     Initial Impression / Assessment and Plan / ED Course  I have reviewed the triage vital signs and the nursing notes.  Pertinent labs & imaging results that were available during my care of the patient were reviewed by me and considered in my medical decision making (see chart for details).       Mario Li is a 38 y.o. male who presents to ED for intermittent chest pain x 4-5 days. On exam, patient is well appearing, afebrile, hemodynamically stable with normal cardiopulmonary exam.   Labs reviewed and reassuring with negative troponin x2.  CXR with no acute abnormalities.  EKG unchanged from previous.   Low risk heart score of 2. PERC negative  Patient's symptoms unlikely to be of cardiac etiology. Labs and imaging reviewed again prior to discharge. Patient has  been advised to return to the ED if development of any exertional chest pain, trouble breathing, new/worsening symptoms or for any additional concerns. Evaluation does not show pathology that would require ongoing emergent intervention or inpatient treatment. Encouraged to follow up with PCP.  Patient understands return precautions and follow up plan. All questions answered.   Final Clinical Impressions(s) / ED Diagnoses   Final diagnoses:  Chest pain with low risk for cardiac etiology    ED Discharge Orders    None       Pelham Hennick, Ozella Almond, PA-C 05/18/19 2135    Fredia Sorrow, MD 05/27/19 1204

## 2019-05-18 NOTE — ED Notes (Signed)
Reviewed Discharge Instructions with pt and pt understood and was allowed time for questions.

## 2019-05-18 NOTE — Discharge Instructions (Signed)
It was my pleasure taking care of you today!   Please call your primary care physician to schedule a follow up appointment to discuss your ER visit today. If you do not have one, see the information below.   Return to the Emergency Department if you experience any further chest pain/pressure/tightness, difficulty breathing, sudden sweating, or other symptoms that concern you.  To find a primary care or specialty doctor please call [phone] or [phone] to access "[facility] Service."  You may also go on the [facility] website at [email or web address]  There are also multiple [facility] practices throughout the [place] that are frequently accepting new patients. You may find a clinic that is close to your home and contact them.  [place] [id number] [phone]  [facility] in [place] (also locations in [place] and [place]) - [place] [phone]  [place] [phone]

## 2019-05-18 NOTE — ED Triage Notes (Signed)
Pt states he has been having CP intermittently for a week. Today started having pain into his left arm and SOB. Pt states he was diaphoretic while at work and having the CP. Pt also states last night he was very nauseated.

## 2019-05-19 ENCOUNTER — Other Ambulatory Visit: Payer: Self-pay | Admitting: Physician Assistant

## 2019-05-26 ENCOUNTER — Telehealth: Payer: No Typology Code available for payment source | Admitting: Family Medicine

## 2019-06-14 ENCOUNTER — Encounter: Payer: Self-pay | Admitting: Family Medicine

## 2019-06-14 ENCOUNTER — Ambulatory Visit: Payer: No Typology Code available for payment source | Attending: Family Medicine | Admitting: Family Medicine

## 2019-06-14 ENCOUNTER — Other Ambulatory Visit: Payer: Self-pay

## 2019-06-14 VITALS — BP 134/90 | HR 88 | Temp 98.8°F | Resp 16 | Ht 66.0 in | Wt 207.0 lb

## 2019-06-14 DIAGNOSIS — Z09 Encounter for follow-up examination after completed treatment for conditions other than malignant neoplasm: Secondary | ICD-10-CM

## 2019-06-14 DIAGNOSIS — R072 Precordial pain: Secondary | ICD-10-CM | POA: Diagnosis not present

## 2019-06-14 DIAGNOSIS — Z789 Other specified health status: Secondary | ICD-10-CM | POA: Insufficient documentation

## 2019-06-14 DIAGNOSIS — K219 Gastro-esophageal reflux disease without esophagitis: Secondary | ICD-10-CM

## 2019-06-14 MED ORDER — PANTOPRAZOLE SODIUM 40 MG PO TBEC: 40.0000 mg | DELAYED_RELEASE_TABLET | Freq: Two times a day (BID) | ORAL | 3 refills | Status: DC

## 2019-06-14 NOTE — Progress Notes (Signed)
Established Patient Office Visit  Subjective:  Patient ID: Mario Li, male    DOB: 11-15-1980  Age: 38 y.o. MRN: [id number]  CC:  Chief Complaint  Patient presents with  . Establish Care    HPI Mario Li presents for follow-up of recent ED visit on 05/18/2019 due to chest pain.  Patient states that on the day of his most recent emergency department visit, he had eaten a taco with green hot sauce from a food truck and then had onset of sensation of mid chest/substernal burning which was also slightly to the left of his chest as well as some discomfort in his left upper abdomen.  He also had the sensation of nausea and sweating.  He denies any radiation of pain to the neck or the jaw but he felt as if he was having some discomfort in his left arm.  He reports that he sometimes gets similar chest and upper abdominal discomfort when he is lying down at night and this causes him to feel anxious.  He does not have any issues with chest pain with activity.  He occasionally has some mild nausea.  He denies any constipation, diarrhea, blood in the stools or loose stools.  No black stools.  He is also concerned as his father died from heart disease/heart attack and he has an uncle who is currently in the hospital/ICU due to heart disease.  Patient states that he has always been healthy and denies any significant past medical issues.  He was seen here in the office earlier this year and placed on medication for acid reflux, omeprazole and evaluated for fatigue.  He reports that he has not been taking the omeprazole as it caused him to feel funny.  He denies any headaches or dizziness, no palpitations, no shortness of breath or cough, no muscle or joint pain and no peripheral edema.  Past Medical History:  Diagnosis Date  . Known health problems: none   on review of chart, patient with low Vitamin D on 02/24/2019 lab work  Past Surgical History:  Procedure Laterality Date  . NO PAST  SURGERIES      Family History  Problem Relation Age of Onset  . Heart disease Father   maternal uncle also with heart disease s/p MI  Social History   Socioeconomic History  . Marital status: Single    Spouse name: Not on file  . Number of children: Not on file  . Years of education: Not on file  . Highest education level: Not on file  Occupational History  . Not on file  Social Needs  . Financial resource strain: Not on file  . Food insecurity    Worry: Not on file    Inability: Not on file  . Transportation needs    Medical: Not on file    Non-medical: Not on file  Tobacco Use  . Smoking status: Never Smoker  . Smokeless tobacco: Never Used  Substance and Sexual Activity  . Alcohol use: Not Currently  . Drug use: Yes    Types: Marijuana    Comment: occ  . Sexual activity: Not on file  Lifestyle  . Physical activity    Days per week: Not on file    Minutes per session: Not on file  . Stress: Not on file  Relationships  . Social Musicianconnections    Talks on phone: Not on file    Gets together: Not on file    Attends religious service: Not on  file    Active member of club or organization: Not on file    Attends meetings of clubs or organizations: Not on file    Relationship status: Not on file  . Intimate partner violence    Fear of current or ex partner: Not on file    Emotionally abused: Not on file    Physically abused: Not on file    Forced sexual activity: Not on file  Other Topics Concern  . Not on file  Social History Narrative  . Not on file    Outpatient Medications Prior to Visit  Medication Sig Dispense Refill  . omeprazole (PRILOSEC) 20 MG capsule Take 20 mg by mouth daily.    . Vitamin D, Ergocalciferol, (DRISDOL) 1.25 MG (50000 UT) CAPS capsule Take 1 capsule (50,000 Units total) by mouth every 7 (seven) days. (Patient not taking: Reported on 06/14/2019) 16 capsule 0   No facility-administered medications prior to visit.     No Known  Allergies  ROS Review of Systems  Constitutional: Positive for diaphoresis (x 1 wiht chest pain after eating from food truck) and fatigue.  HENT: Negative for sore throat and trouble swallowing.   Eyes: Negative for photophobia and visual disturbance.  Respiratory: Negative for cough, chest tightness and shortness of breath.   Cardiovascular: Positive for chest pain (substernal and sometimes slightly to the left). Negative for palpitations and leg swelling.  Gastrointestinal: Positive for nausea (occasional nausea). Negative for abdominal pain.  Endocrine: Negative for cold intolerance, heat intolerance, polydipsia, polyphagia and polyuria.  Genitourinary: Negative for dysuria and frequency.  Musculoskeletal: Negative for arthralgias, back pain and gait problem.  Neurological: Negative for dizziness and headaches.  Hematological: Negative for adenopathy. Does not bruise/bleed easily.  Psychiatric/Behavioral: Negative for self-injury, sleep disturbance and suicidal ideas. The patient is nervous/anxious (about health).       Objective:    Physical Exam  Constitutional: He is oriented to person, place, and time. He appears well-developed and well-nourished.  Neck: No JVD present. No thyromegaly present.  Cardiovascular: Normal rate and regular rhythm.  no carotid bruit  Pulmonary/Chest: Effort normal and breath sounds normal.  Abdominal: Soft. There is no abdominal tenderness. There is no rebound and no guarding.  Musculoskeletal:        General: No tenderness or edema.     Comments: No CVA tenderness  Neurological: He is alert and oriented to person, place, and time.  Skin: Skin is warm and dry.  Psychiatric: He has a normal mood and affect. His behavior is normal.  Nursing note and vitals reviewed.   BP 134/90   Pulse 88   Temp 98.8 F (37.1 C) (Oral)   Resp 16   Ht 5\' 6"  (1.676 m)   Wt 207 lb (93.9 kg)   SpO2 96%   BMI 33.41 kg/m  Wt Readings from Last 3 Encounters:   06/14/19 207 lb (93.9 kg)  05/18/19 190 lb (86.2 kg)  03/09/19 197 lb (89.4 kg)     Health Maintenance Due  Topic Date Due  . HIV Screening  01/18/1994  . TETANUS/TDAP  01/18/1998  . INFLUENZA VACCINE  04/29/2019   Influenza immunization offered at today's visit but declined by the patient   Lab Results  Component Value Date   TSH 0.760 02/11/2019   Lab Results  Component Value Date   WBC 5.4 05/18/2019   HGB 15.8 05/18/2019   HCT 47.0 05/18/2019   MCV 94.6 05/18/2019   PLT 290 05/18/2019  Lab Results  Component Value Date   NA 139 05/18/2019   K 3.6 05/18/2019   CO2 22 05/18/2019   GLUCOSE 90 05/18/2019   BUN 6 05/18/2019   CREATININE 1.16 05/18/2019   BILITOT 0.9 02/11/2019   ALKPHOS 74 02/11/2019   AST 26 02/11/2019   ALT 25 02/11/2019   PROT 7.8 02/11/2019   ALBUMIN 4.5 02/11/2019   CALCIUM 9.5 05/18/2019   ANIONGAP 13 05/18/2019   Lab Results  Component Value Date   CHOL 133 02/24/2019   Lab Results  Component Value Date   HDL 45 02/24/2019   Lab Results  Component Value Date   LDLCALC 73 02/24/2019   Lab Results  Component Value Date   TRIG 76 02/24/2019   Lab Results  Component Value Date   CHOLHDL 3.0 02/24/2019   No results found for: HGBA1C    Assessment & Plan:  1. Substernal chest pain; GERD; 4. Encounter for examination following treatment at hospital Patient reports issues with substernal chest pain with recent episode after eating a taco food truck.  Patient symptoms are likely acid reflux related but as patient also reports a family history of CAD in his father and maternal uncle, patient will also be referred to cardiology for further evaluation and treatment.  Patient has had normal lipid panel in May of this year.  Patient is not currently taking the prescribed omeprazole therefore will place patient on pantoprazole twice daily and patient will return to have complete metabolic panel as well as antibody testing for H. pylori.   Patient will also be referred to gastroenterology for further evaluation and treatment.  Patient has been provided with information on acid reflux as well as foods to avoid secondary to acid reflux.  Discussed avoidance of spicy/greasy foods as well as foods that are known to trigger acid reflux such as tomato sauce, carbonated beverages and chocolate/peppermint in some cases.  Also encouraged avoidance of eating within 2 hours of bedtime.  Patient had normal chest x-ray during recent emergency department visit as well as normal BMP and CBC however complete metabolic panel will be rechecked at upcoming lab visit in addition to H. pylori antibodies. - Ambulatory referral to Gastroenterology - Ambulatory referral to Cardiology - pantoprazole (PROTONIX) 40 MG tablet; Take 1 tablet (40 mg total) by mouth 2 (two) times daily. To reduce stomach acid  Dispense: 60 tablet; Refill: 3 - H Pylori, IGM, IGG, IGA AB; Future  Meds ordered this encounter  Medications  . pantoprazole (PROTONIX) 40 MG tablet    Sig: Take 1 tablet (40 mg total) by mouth 2 (two) times daily. To reduce stomach acid    Dispense:  60 tablet    Refill:  3   An After Visit Summary was printed and given to the patient.  Follow-up: Return in about 4 weeks (around 07/12/2019) for labs in 1-2 weeks.    Antony Blackbird, MD

## 2019-06-14 NOTE — Patient Instructions (Signed)
Gastroesophageal Reflux Disease, Adult Gastroesophageal reflux (GER) happens when acid from the stomach flows up into the tube that connects the mouth and the stomach (esophagus). Normally, food travels down the esophagus and stays in the stomach to be digested. With GER, food and stomach acid sometimes move back up into the esophagus. You may have a disease called gastroesophageal reflux disease (GERD) if the reflux:  Happens often.  Causes frequent or very bad symptoms.  Causes problems such as damage to the esophagus. When this happens, the esophagus becomes sore and swollen (inflamed). Over time, GERD can make small holes (ulcers) in the lining of the esophagus. What are the causes? This condition is caused by a problem with the muscle between the esophagus and the stomach. When this muscle is weak or not normal, it does not close properly to keep food and acid from coming back up from the stomach. The muscle can be weak because of:  Tobacco use.  Pregnancy.  Having a certain type of hernia (hiatal hernia).  Alcohol use.  Certain foods and drinks, such as coffee, chocolate, onions, and peppermint. What increases the risk? You are more likely to develop this condition if you:  Are overweight.  Have a disease that affects your connective tissue.  Use NSAID medicines. What are the signs or symptoms? Symptoms of this condition include:  Heartburn.  Difficult or painful swallowing.  The feeling of having a lump in the throat.  A bitter taste in the mouth.  Bad breath.  Having a lot of saliva.  Having an upset or bloated stomach.  Belching.  Chest pain. Different conditions can cause chest pain. Make sure you see your doctor if you have chest pain.  Shortness of breath or noisy breathing (wheezing).  Ongoing (chronic) cough or a cough at night.  Wearing away of the surface of teeth (tooth enamel).  Weight loss. How is this treated? Treatment will depend on how  bad your symptoms are. Your doctor may suggest:  Changes to your diet.  Medicine.  Surgery. Follow these instructions at home: Eating and drinking   Follow a diet as told by your doctor. You may need to avoid foods and drinks such as: ? Coffee and tea (with or without caffeine). ? Drinks that contain alcohol. ? Energy drinks and sports drinks. ? Bubbly (carbonated) drinks or sodas. ? Chocolate and cocoa. ? Peppermint and mint flavorings. ? Garlic and onions. ? Horseradish. ? Spicy and acidic foods. These include peppers, chili powder, curry powder, vinegar, hot sauces, and BBQ sauce. ? Citrus fruit juices and citrus fruits, such as oranges, lemons, and limes. ? Tomato-based foods. These include red sauce, chili, salsa, and pizza with red sauce. ? Fried and fatty foods. These include donuts, french fries, potato chips, and high-fat dressings. ? High-fat meats. These include hot dogs, rib eye steak, sausage, ham, and bacon. ? High-fat dairy items, such as whole milk, butter, and cream cheese.  Eat small meals often. Avoid eating large meals.  Avoid drinking large amounts of liquid with your meals.  Avoid eating meals during the 2-3 hours before bedtime.  Avoid lying down right after you eat.  Do not exercise right after you eat. Lifestyle   Do not use any products that contain nicotine or tobacco. These include cigarettes, e-cigarettes, and chewing tobacco. If you need help quitting, ask your doctor.  Try to lower your stress. If you need help doing this, ask your doctor.  If you are overweight, lose an amount   of weight that is healthy for you. Ask your doctor about a safe weight loss goal. General instructions  Pay attention to any changes in your symptoms.  Take over-the-counter and prescription medicines only as told by your doctor. Do not take aspirin, ibuprofen, or other NSAIDs unless your doctor says it is okay.  Wear loose clothes. Do not wear anything tight  around your waist.  Raise (elevate) the head of your bed about 6 inches (15 cm).  Avoid bending over if this makes your symptoms worse.  Keep all follow-up visits as told by your doctor. This is important. Contact a doctor if:  You have new symptoms.  You lose weight and you do not know why.  You have trouble swallowing or it hurts to swallow.  You have wheezing or a cough that keeps happening.  Your symptoms do not get better with treatment.  You have a hoarse voice. Get help right away if:  You have pain in your arms, neck, jaw, teeth, or back.  You feel sweaty, dizzy, or light-headed.  You have chest pain or shortness of breath.  You throw up (vomit) and your throw-up looks like blood or coffee grounds.  You pass out (faint).  Your poop (stool) is bloody or black.  You cannot swallow, drink, or eat. Summary  If a person has gastroesophageal reflux disease (GERD), food and stomach acid move back up into the esophagus and cause symptoms or problems such as damage to the esophagus.  Treatment will depend on how bad your symptoms are.  Follow a diet as told by your doctor.  Take all medicines only as told by your doctor. This information is not intended to replace advice given to you by your health care provider. Make sure you discuss any questions you have with your health care provider. Document Released: 03/02/2008 Document Revised: 03/23/2018 Document Reviewed: 03/23/2018 Elsevier Patient Education  2020 [employer] Food Choices for Gastroesophageal Reflux Disease, Adult When you have gastroesophageal reflux disease (GERD), the foods you eat and your eating habits are very important. Choosing the right foods can help ease your discomfort. Think about working with a nutrition specialist (dietitian) to help you make good choices. What are tips for following this plan?  Meals  Choose healthy foods that are low in fat, such as fruits, vegetables, whole grains,  low-fat dairy products, and lean meat, fish, and poultry.  Eat small meals often instead of 3 large meals a day. Eat your meals slowly, and in a place where you are relaxed. Avoid bending over or lying down until 2-3 hours after eating.  Avoid eating meals 2-3 hours before bed.  Avoid drinking a lot of liquid with meals.  Cook foods using methods other than frying. Bake, grill, or broil food instead.  Avoid or limit: ? Chocolate. ? Peppermint or spearmint. ? Alcohol. ? Pepper. ? Black and decaffeinated coffee. ? Black and decaffeinated tea. ? Bubbly (carbonated) soft drinks. ? Caffeinated energy drinks and soft drinks.  Limit high-fat foods such as: ? Fatty meat or fried foods. ? Whole milk, cream, butter, or ice cream. ? Nuts and nut butters. ? Pastries, donuts, and sweets made with butter or shortening.  Avoid foods that cause symptoms. These foods may be different for everyone. Common foods that cause symptoms include: ? Tomatoes. ? Oranges, lemons, and limes. ? Peppers. ? Spicy food. ? Onions and garlic. ? Vinegar. Lifestyle  Maintain a healthy weight. Ask your doctor what weight is healthy for you.   If you need to lose weight, work with your doctor to do so safely.  Exercise for at least 30 minutes for 5 or more days each week, or as told by your doctor.  Wear loose-fitting clothes.  Do not smoke. If you need help quitting, ask your doctor.  Sleep with the head of your bed higher than your feet. Use a wedge under the mattress or blocks under the bed frame to raise the head of the bed. Summary  When you have gastroesophageal reflux disease (GERD), food and lifestyle choices are very important in easing your symptoms.  Eat small meals often instead of 3 large meals a day. Eat your meals slowly, and in a place where you are relaxed.  Limit high-fat foods such as fatty meat or fried foods.  Avoid bending over or lying down until 2-3 hours after eating.  Avoid  peppermint and spearmint, caffeine, alcohol, and chocolate. This information is not intended to replace advice given to you by your health care provider. Make sure you discuss any questions you have with your health care provider. Document Released: 03/15/2012 Document Revised: 01/05/2019 Document Reviewed: 10/20/2016 Elsevier Patient Education  2020 Elsevier Inc.  

## 2019-06-21 ENCOUNTER — Other Ambulatory Visit: Payer: No Typology Code available for payment source

## 2019-06-26 ENCOUNTER — Ambulatory Visit: Payer: No Typology Code available for payment source | Attending: Family Medicine

## 2019-06-26 ENCOUNTER — Other Ambulatory Visit: Payer: Self-pay

## 2019-06-26 DIAGNOSIS — K219 Gastro-esophageal reflux disease without esophagitis: Secondary | ICD-10-CM

## 2019-06-26 NOTE — Addendum Note (Signed)
Addended by: Emilio Aspen A on: 06/26/2019 11:17 AM   Modules accepted: Orders

## 2019-06-28 ENCOUNTER — Telehealth: Payer: Self-pay | Admitting: General Practice

## 2019-06-28 DIAGNOSIS — Z09 Encounter for follow-up examination after completed treatment for conditions other than malignant neoplasm: Secondary | ICD-10-CM

## 2019-06-28 LAB — H PYLORI, IGM, IGG, IGA AB
H pylori, IgM Abs: <9 U (ref 0.0–8.9)
H. pylori, IgA Abs: <9 U (ref 0.0–8.9)
H. pylori, IgG AbS: 0.6 {index_val} (ref 0.00–0.79)

## 2019-06-28 NOTE — Telephone Encounter (Signed)
Patient called to get their results. Please follow up. °

## 2019-06-29 NOTE — Telephone Encounter (Signed)
Patient called back to check on the status of his results. Please follow up.

## 2019-06-29 NOTE — Telephone Encounter (Signed)
Spoke with patient and he stated he only see the results for the H.pylori and not the blood work via his Pharmacist, community. Informed patient that provider have to review his lab first and then it will be released to his mychart. Patient verbalized understanding.

## 2019-06-30 LAB — COMPREHENSIVE METABOLIC PANEL WITH GFR
ALT: 24 IU/L (ref 0–44)
AST: 22 IU/L (ref 0–40)
Albumin/Globulin Ratio: 1.6 (ref 1.2–2.2)
Albumin: 4.2 g/dL (ref 4.0–5.0)
Alkaline Phosphatase: 109 IU/L (ref 39–117)
BUN/Creatinine Ratio: 15 (ref 9–20)
BUN: 15 mg/dL (ref 6–24)
Bilirubin Total: <0.2 mg/dL (ref 0.0–1.2)
CO2: 19 mmol/L — ABNORMAL LOW (ref 20–29)
Calcium: 9.2 mg/dL (ref 8.7–10.2)
Chloride: 103 mmol/L (ref 96–106)
Creatinine, Ser: 0.98 mg/dL (ref 0.76–1.27)
GFR calc Af Amer: 111 mL/min/1.73
GFR calc non Af Amer: 96 mL/min/1.73
Globulin, Total: 2.6 g/dL (ref 1.5–4.5)
Glucose: 97 mg/dL (ref 65–99)
Potassium: 4.1 mmol/L (ref 3.5–5.2)
Sodium: 140 mmol/L (ref 134–144)
Total Protein: 6.8 g/dL (ref 6.0–8.5)

## 2019-06-30 LAB — SPECIMEN STATUS REPORT

## 2019-07-03 NOTE — Telephone Encounter (Signed)
Informed patient with his results and he verbalized understanding 

## 2019-07-03 NOTE — Telephone Encounter (Signed)
Patient called back stating he received a call in regards to his results. Please follow up.

## 2019-07-05 ENCOUNTER — Ambulatory Visit: Payer: No Typology Code available for payment source | Attending: Family Medicine | Admitting: Family Medicine

## 2019-07-05 ENCOUNTER — Encounter: Payer: Self-pay | Admitting: Family Medicine

## 2019-07-05 DIAGNOSIS — I10 Essential (primary) hypertension: Secondary | ICD-10-CM

## 2019-07-05 MED ORDER — AMLODIPINE BESYLATE 2.5 MG PO TABS: 2.5000 mg | ORAL_TABLET | Freq: Every day | ORAL | 3 refills | Status: DC

## 2019-07-05 NOTE — Progress Notes (Signed)
Patient is concerned about BP he checked it yesterday and it was 145/105 and he had a headache.

## 2019-07-05 NOTE — Progress Notes (Signed)
Virtual Visit via Telephone Note  I connected with Mario Li, on 07/05/2019 at 3:25 PM by telephone due to the COVID-19 pandemic and verified that I am speaking with the correct person using two identifiers.   Consent: I discussed the limitations, risks, security and privacy concerns of performing an evaluation and management service by telephone and the availability of in person appointments. I also discussed with the patient that there may be a patient responsible charge related to this service. The patient expressed understanding and agreed to proceed.   Location of Patient: Work  Biomedical scientist of Provider: Clinic   Persons participating in Telemedicine visit: Shahmeer Marsean Elkhatib Farrington-CMA Dr. Felecia Shelling     History of Present Illness: Mario Li is a 38 year old who is seen for an office visit. He complains of a BP of 145/105 after eating a [employer] and feels he is unable to keep it under control. Other readings have been 111/81 today at 3pm, 125/84 at 11:22am, 140/99 at 9am. Yesterday it was 135/98 in the morning and 161/97 in the evening. At his last visit it was 134/90.and he is currently not on an antihypertensive. Denies chest pains, dyspnea.  Past Medical History:  Diagnosis Date  . Known health problems: none    No Known Allergies  Current Outpatient Medications on File Prior to Visit  Medication Sig Dispense Refill  . omeprazole (PRILOSEC) 20 MG capsule Take 20 mg by mouth daily.    . pantoprazole (PROTONIX) 40 MG tablet Take 1 tablet (40 mg total) by mouth 2 (two) times daily. To reduce stomach acid (Patient not taking: Reported on 07/05/2019) 60 tablet 3  . Vitamin D, Ergocalciferol, (DRISDOL) 1.25 MG (50000 UT) CAPS capsule Take 1 capsule (50,000 Units total) by mouth every 7 (seven) days. (Patient not taking: Reported on 06/14/2019) 16 capsule 0   No current facility-administered medications on file prior to visit.      Observations/Objective: Alert, awake, oriented x3 Not in acute distress  Assessment and Plan: 1. Essential hypertension Fluctuating blood pressure Will commence on low dose Amlodipine Counseled on blood pressure goal of less than 130/80, low-sodium, DASH diet, medication compliance, 150 minutes of moderate intensity exercise per week. Discussed medication compliance, adverse effects. - amLODipine (NORVASC) 2.5 MG tablet; Take 1 tablet (2.5 mg total) by mouth daily.  Dispense: 30 tablet; Refill: 3   Follow Up Instructions: Return in about 1 month (around 08/05/2019) for follow up on hypertension.    I discussed the assessment and treatment plan with the patient. The patient was provided an opportunity to ask questions and all were answered. The patient agreed with the plan and demonstrated an understanding of the instructions.   The patient was advised to call back or seek an in-person evaluation if the symptoms worsen or if the condition fails to improve as anticipated.     I provided 15 minutes total of non-face-to-face time during this encounter including median intraservice time, reviewing previous notes, labs, imaging, medications, management and patient verbalized understanding.     Charlott Rakes, MD, FAAFP. [facility] [place], [place]   07/05/2019, 3:25 PM

## 2019-07-12 ENCOUNTER — Ambulatory Visit: Payer: No Typology Code available for payment source | Admitting: Cardiology

## 2019-07-14 ENCOUNTER — Ambulatory Visit: Payer: No Typology Code available for payment source | Attending: Family Medicine

## 2019-07-14 ENCOUNTER — Other Ambulatory Visit: Payer: Self-pay

## 2019-07-14 ENCOUNTER — Ambulatory Visit: Payer: No Typology Code available for payment source | Admitting: Family Medicine

## 2019-07-15 LAB — CBC
Hematocrit: 42.2 % (ref 37.5–51.0)
Hemoglobin: 14.7 g/dL (ref 13.0–17.7)
MCH: 32 pg (ref 26.6–33.0)
MCHC: 34.8 g/dL (ref 31.5–35.7)
MCV: 92 fL (ref 79–97)
Platelets: 266 x10E3/uL (ref 150–450)
RBC: 4.59 x10E6/uL (ref 4.14–5.80)
RDW: 11.3 % — ABNORMAL LOW (ref 11.6–15.4)
WBC: 5.9 x10E3/uL (ref 3.4–10.8)

## 2019-07-15 LAB — THYROID PANEL WITH TSH
Free Thyroxine Index: 1.7 (ref 1.2–4.9)
T3 Uptake Ratio: 26 % (ref 24–39)
T4, Total: 6.7 ug/dL (ref 4.5–12.0)
TSH: 1.1 u[IU]/mL (ref 0.450–4.500)

## 2019-07-21 ENCOUNTER — Encounter: Payer: Self-pay | Admitting: *Deleted

## 2019-08-18 ENCOUNTER — Encounter: Payer: No Typology Code available for payment source | Admitting: Family Medicine

## 2019-09-07 ENCOUNTER — Encounter: Payer: No Typology Code available for payment source | Admitting: Family Medicine

## 2019-10-06 ENCOUNTER — Other Ambulatory Visit: Payer: Self-pay

## 2019-10-06 ENCOUNTER — Ambulatory Visit: Payer: No Typology Code available for payment source | Attending: Family Medicine | Admitting: Family Medicine

## 2019-10-06 ENCOUNTER — Encounter: Payer: Self-pay | Admitting: Family Medicine

## 2019-10-06 DIAGNOSIS — K219 Gastro-esophageal reflux disease without esophagitis: Secondary | ICD-10-CM

## 2019-10-06 DIAGNOSIS — I10 Essential (primary) hypertension: Secondary | ICD-10-CM | POA: Diagnosis not present

## 2019-10-06 NOTE — Progress Notes (Signed)
Virtual Visit via Telephone Note  I connected with Mario Li on 10/06/19 at 10:10 AM EST by telephone and verified that I am speaking with the correct person using two identifiers.   I discussed the limitations, risks, security and privacy concerns of performing an evaluation and management service by telephone and the availability of in person appointments. I also discussed with the patient that there may be a patient responsible charge related to this service. The patient expressed understanding and agreed to proceed.  Patient Location: Home Provider Location: [facility] Others participating in call: none   History of Present Illness:      39 year old male who was initially scheduled for annual well exam but appointment was changed to follow-up of chronic medical issues due to threat of inclement weather.  He reports that he is no longer taking the medication, amlodipine, for treatment of hypertension.  He has been monitoring his blood pressures and since they were normal he discontinue use of the medication.  He believes that his blood pressure was initially elevated on the day that the medication was started because of the foods that he had been eating leading up to his visit.  He reports that he has made some dietary changes and believes that his blood pressure has been within normal.  He denies any headaches or dizziness related to his blood pressure.  He has a home blood pressure monitor and reports that his blood pressure has been normal but he cannot recall the exact numbers.          He reports that he is taking pantoprazole for acid reflux on a daily basis.  He denies any current issues with abdominal pain-no nausea/vomiting/diarrhea or constipation.  No blood in the stool and no black stools.  He reports that overall he feels well.  He denies any current issues with acid reflux unless he eats foods that tend to trigger his symptoms.  He feels the pantoprazole is working well.  On  additional review of systems, he denies any chest pain or palpitations, no shortness of breath or cough, no fever or chills, no sore throat or difficulty swallowing, no issues with fatigue, no urinary frequency, urgency or dysuria, no current muscle or joint pain and no peripheral edema.  Past Medical History:  Diagnosis Date  . Known health problems: none   PMH; history of elevated blood pressure/hypertension; GERD  Past Surgical History:  Procedure Laterality Date  . NO PAST SURGERIES      Family History  Problem Relation Age of Onset  . Heart disease Father     Social History   Tobacco Use  . Smoking status: Never Smoker  . Smokeless tobacco: Never Used  Substance Use Topics  . Alcohol use: Not Currently  . Drug use: Yes    Types: Marijuana    Comment: occ     No Known Allergies     Observations/Objective: No vital signs or physical exam conducted as visit was done via telephone  Assessment and Plan: 1. Essential hypertension Patient was diagnosed with hypertension at 07/05/2019 telemedicine visit based on elevated readings from home monitor per chart note.  He was started on amlodipine 2.5 mg per patient states that his home blood pressures have now normalized and he is no longer taking medication.  He has been asked to come into the office at his next visit/schedule for annual well exam in the next 1 to 2 months and bring home blood pressure monitor to make sure  that his home readings are accurate as patient does have family history of mother with hypertension and stroke in father with heart disease and heart attack.  Low-sodium, high potassium diet discussed and he will be given further information regarding DASH diet at his next visit.  He was made aware that he should be fasting for upcoming well exam.  2. GERD without esophagitis He is currently on pantoprazole 40 mg daily without any reflux symptoms.  Continue to avoid known trigger foods and continue to avoid eating  within 2 hours of bedtime.  Follow Up Instructions: Reschedule annual physical exam    I discussed the assessment and treatment plan with the patient. The patient was provided an opportunity to ask questions and all were answered. The patient agreed with the plan and demonstrated an understanding of the instructions.   The patient was advised to call back or seek an in-person evaluation if the symptoms worsen or if the condition fails to improve as anticipated.  I provided 13  minutes of non-face-to-face time during this encounter.   Cain Saupe, MD

## 2019-10-06 NOTE — Progress Notes (Signed)
Patient has been called and DOB has been verified. Patient has been screened and transferred to PCP to start phone visit.     

## 2019-10-08 ENCOUNTER — Encounter: Payer: Self-pay | Admitting: Family Medicine

## 2019-10-23 ENCOUNTER — Telehealth: Payer: Self-pay | Admitting: Family Medicine

## 2019-10-23 NOTE — Telephone Encounter (Signed)
Patient called wanting to get advice as he states he may have infection in throat or stomach. Offered to schedule an appointment and patient declined would like to get a call from Provider.

## 2019-10-24 NOTE — Telephone Encounter (Signed)
Returned call. Spoke with the patient c /o bad breath X 2 to 3 months. Recently seen by the dentist and cleaning was done. Denies any cavities. Stated oral care being done  daily.  Stated had tested for H Pylori back in September and test was negative. Denies any sore throat, swollen tonsils, mouth sores or white patches. Denies any fevers. Denies any diet change.  Patient Has a physical scheduled on 11/02/2018. Please advice!

## 2019-10-24 NOTE — Telephone Encounter (Signed)
Please ask patient if he has any sinus issues/nasal congestion which may be contributing to bad breath. Also make sure to brush the surface of the tongue and other oral surfaces when brushing his teeth and consider use of mouthwash as well. Otherwise keep upcoming appointment for physical

## 2019-10-24 NOTE — Telephone Encounter (Signed)
Spoke with patient. Stated he does not know to have any sinus issues. However stated Left nostril has been always "little stuffed up". Denies any cold like or flu like symptoms. Denies any loss of smell or taste.Denies headache or any pain. Instructed  Pt to brush the surface of the tongue and other oral surfaces when brushing his teeth and consider use of mouthwash as well.  Please advice if anything else is needed at this time. Thank you!

## 2019-11-03 ENCOUNTER — Encounter: Payer: No Typology Code available for payment source | Admitting: Family Medicine

## 2020-02-02 ENCOUNTER — Encounter: Payer: No Typology Code available for payment source | Admitting: Family Medicine

## 2020-03-07 IMAGING — CR CHEST - 2 VIEW
2 series · 2 of 2 positions shown · non-contrast
Comparison: 02/11/2019

CLINICAL DATA: RIGHT-sided chest pain

EXAM:
CHEST - 2 VIEW

[chest pa]
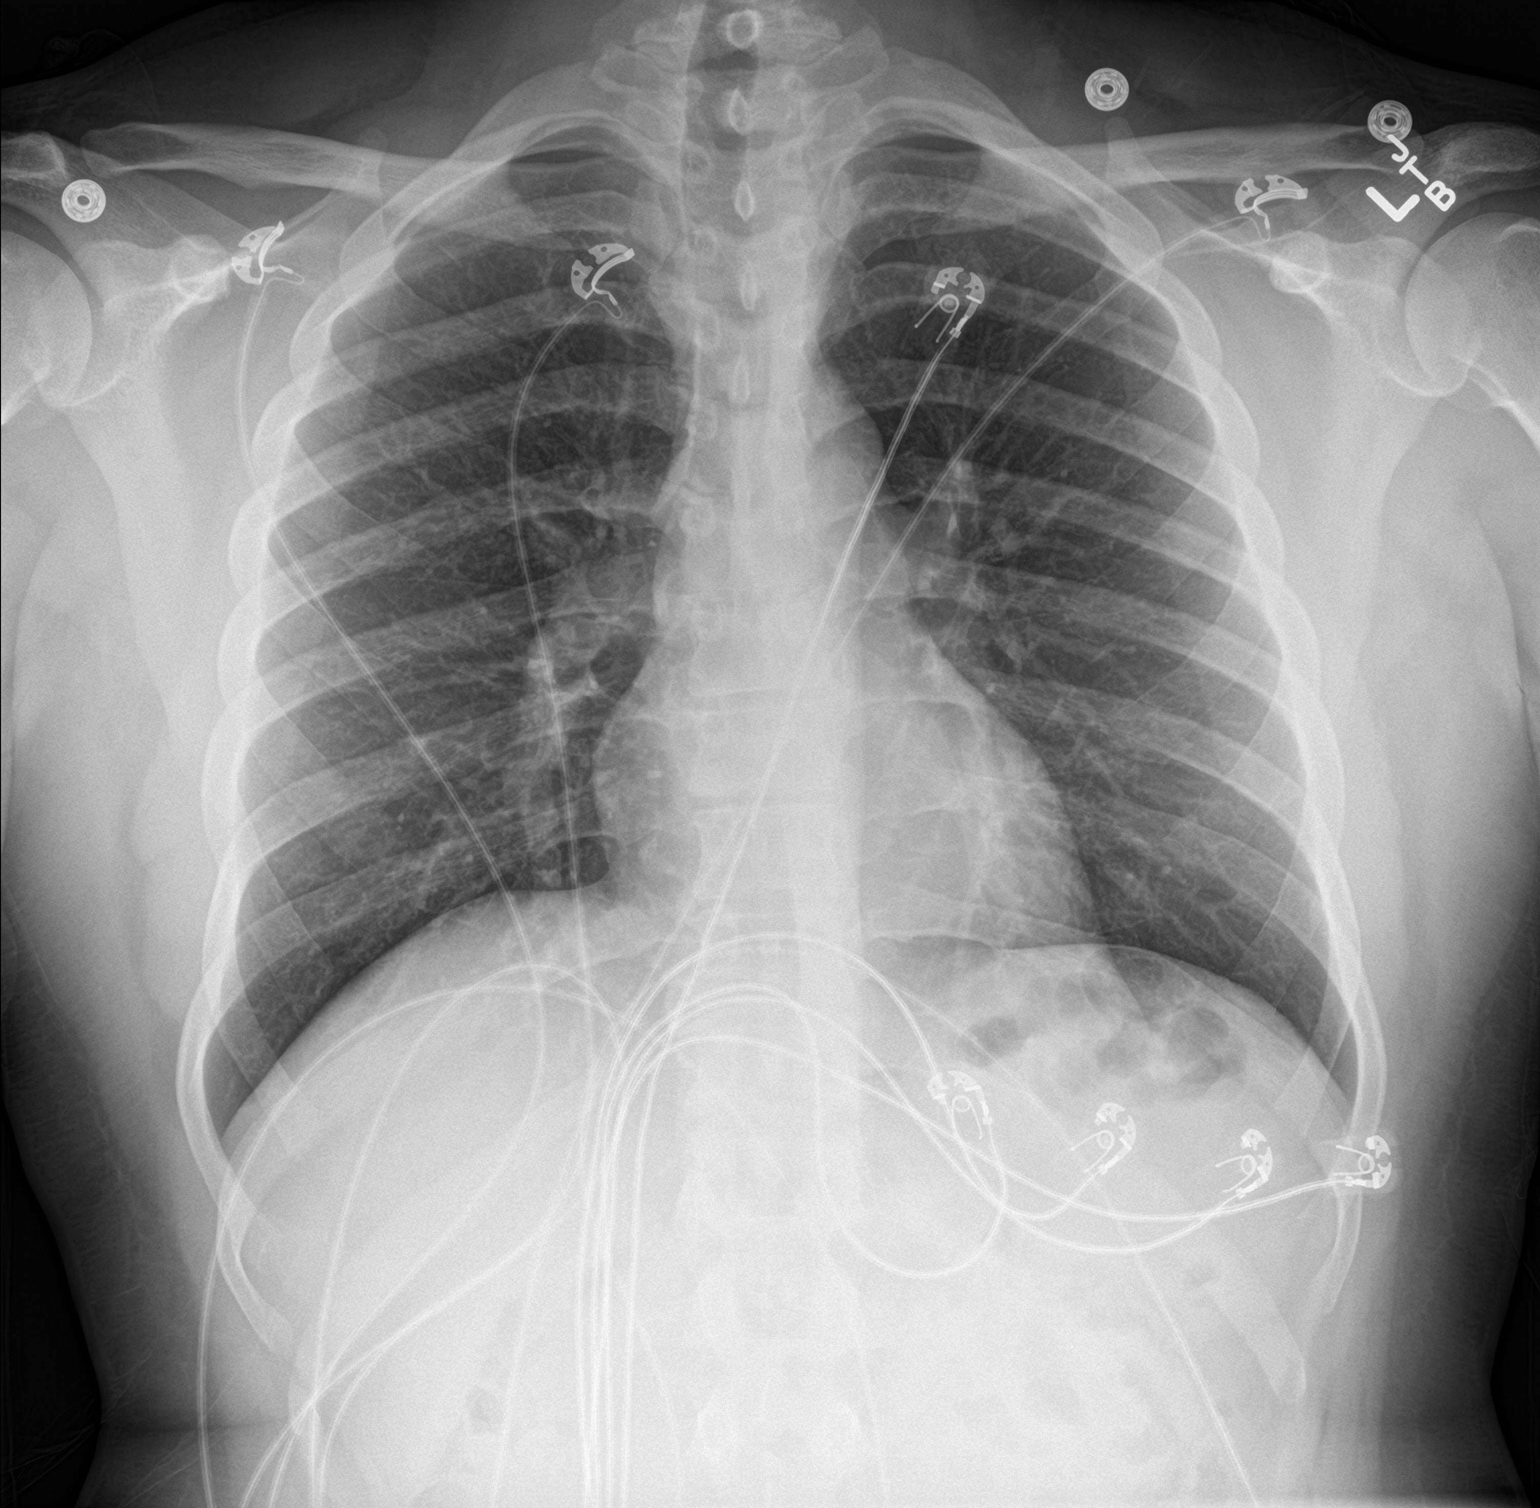

[chest lat]
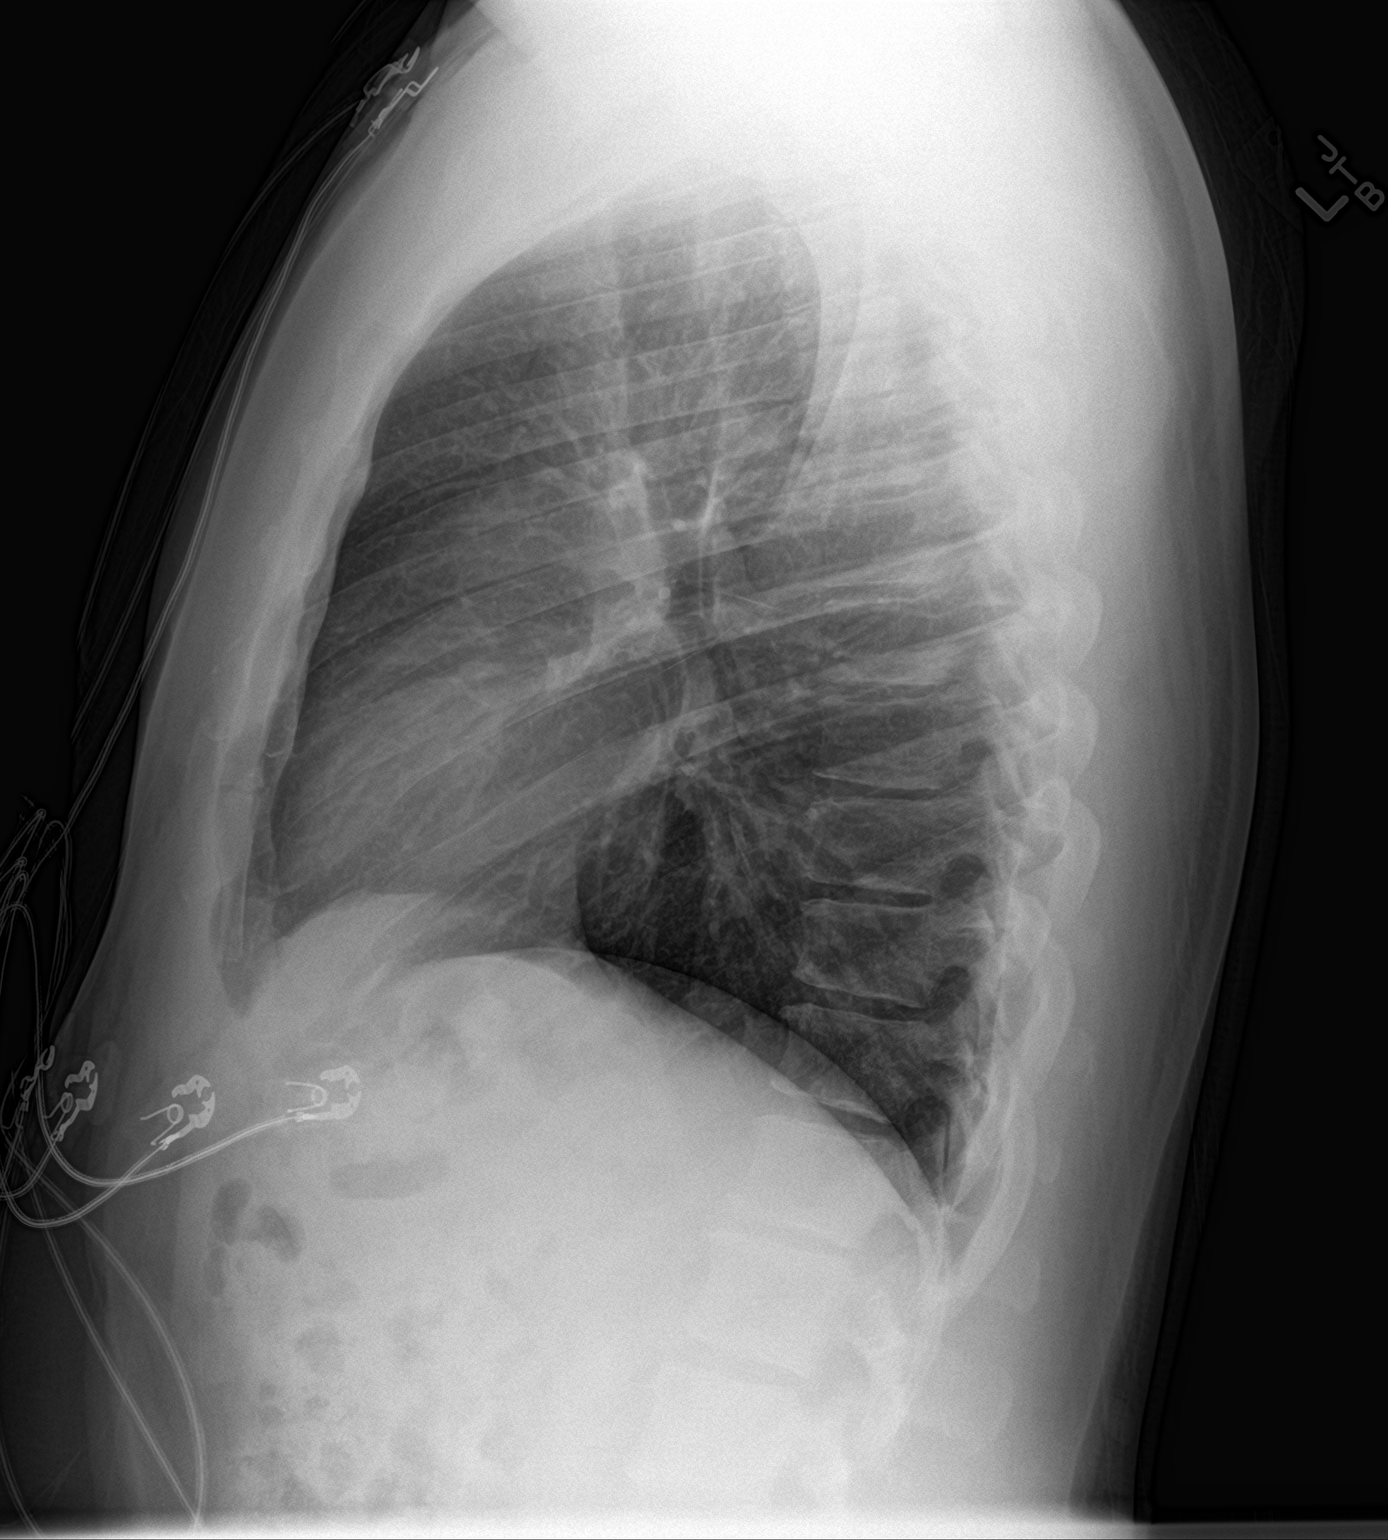

[2 of 2 positions shown; findings below may reference images not displayed]

FINDINGS: Normal mediastinum and cardiac silhouette. Normal pulmonary
vasculature. No evidence of effusion, infiltrate, or pneumothorax.
No acute bony abnormality.
IMPRESSION: No acute cardiopulmonary process.

## 2020-10-22 ENCOUNTER — Ambulatory Visit: Payer: No Typology Code available for payment source | Admitting: Pulmonary Disease

## 2020-10-24 ENCOUNTER — Other Ambulatory Visit: Payer: Self-pay | Admitting: Physician Assistant

## 2020-10-24 ENCOUNTER — Ambulatory Visit
Admission: RE | Admit: 2020-10-24 | Discharge: 2020-10-24 | Disposition: A | Discharge status: Home / Self Care | Payer: BLUE CROSS/BLUE SHIELD | Source: Ambulatory Visit | Attending: Physician Assistant | Admitting: Physician Assistant

## 2020-10-24 DIAGNOSIS — R109 Unspecified abdominal pain: Secondary | ICD-10-CM

## 2020-10-24 DIAGNOSIS — K5900 Constipation, unspecified: Secondary | ICD-10-CM

## 2020-12-30 ENCOUNTER — Ambulatory Visit: Payer: BLUE CROSS/BLUE SHIELD | Admitting: Pulmonary Disease

## 2020-12-30 ENCOUNTER — Encounter: Payer: Self-pay | Admitting: Pulmonary Disease

## 2020-12-30 ENCOUNTER — Other Ambulatory Visit: Payer: Self-pay

## 2020-12-30 ENCOUNTER — Ambulatory Visit (INDEPENDENT_AMBULATORY_CARE_PROVIDER_SITE_OTHER): Payer: Self-pay | Admitting: Pulmonary Disease

## 2020-12-30 VITALS — BP 128/82 | HR 83 | Temp 97.6°F | Ht 66.0 in | Wt 227.6 lb

## 2020-12-30 DIAGNOSIS — U099 Post covid-19 condition, unspecified: Secondary | ICD-10-CM

## 2020-12-30 DIAGNOSIS — R059 Cough, unspecified: Secondary | ICD-10-CM

## 2020-12-30 NOTE — Progress Notes (Signed)
@Patient  ID: , male    DOB: [birth year], 40 y.o.   MRN: [id number]  Chief Complaint  Patient presents with  . Consult    States he was diagnosed with COVID back in 2021. Wants to make sure his lungs are clear. Denies any current symptoms.     Referring provider: 2022, DO  HPI:   40 year old whom we are seeing in consultation for evaluation of cough, post Covid infection 2021.  PCP note reviewed.  Overall, patient doing well.  He denies any dyspnea.  Can do all task during the day without respiratory issues.  Chief complaint today is cough.  It is intermittent.  Can go some time without coughing in between.  He does produce sputum when he coughs.  This is quite foul-smelling per his report.  On additional questioning there is often or almost always a consistent foul smell from his breath etc even in the absence of coughing.  He is seen by his dentist per his report and told everything was fine.  He has been seen by his PCP for these same complaints without any significant diagnosis that may explain this.  He denies any odynophagia or dysphagia.  He denies reflux symptoms.  Cough is intermittent, mild.  No timing during day were things are better or worse.  No seasonal changes or environmental factors that contribute to things being better or worse.  No alleviating or exacerbating factors.  He is a never smoker.  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  MMRC: mMRC Dyspnea Scale mMRC Score  12/30/2020 0    Epworth:  No flowsheet data found.  Tests:   FENO:  No results found for: NITRICOXIDE  PFT: No flowsheet data found.  WALK:   No flowsheet data found.  Imaging: Chest x-ray 04/2019 precoated personally reviewed and interpreted as clear lungs, no hyperinflation, no effusion or infiltrate  Lab Results: Personally reviewed and as per EMR CBC    Component Value Date/Time   WBC 5.9 07/14/2019 1651   WBC 5.4 05/18/2019 1442   RBC 4.59 07/14/2019 1651   RBC 4.97 05/18/2019 1442   HGB 14.7 07/14/2019 1651   HCT 42.2 07/14/2019 1651   PLT 266 07/14/2019 1651   MCV 92 07/14/2019 1651   MCH 32.0 07/14/2019 1651   MCH 31.8 05/18/2019 1442   MCHC 34.8 07/14/2019 1651   MCHC 33.6 05/18/2019 1442   RDW 11.3 (L) 07/14/2019 1651    BMET    Component Value Date/Time   NA 140 06/26/2019 0925   K 4.1 06/26/2019 0925   CL 103 06/26/2019 0925   CO2 19 (L) 06/26/2019 0925   GLUCOSE 97 06/26/2019 0925   GLUCOSE 90 05/18/2019 1442   BUN 15 06/26/2019 0925   CREATININE 0.98 06/26/2019 0925   CALCIUM 9.2 06/26/2019 0925   GFRNONAA 96 06/26/2019 0925   GFRAA 111 06/26/2019 0925    BNP No results found for: BNP  ProBNP No results found for: PROBNP  Specialty Problems   None     No Known Allergies   There is no immunization history on file for this patient.  Past Medical History:  Diagnosis Date  . Known health problems: none  Tobacco History: Social History   Tobacco Use  Smoking Status Never Smoker  Smokeless Tobacco Never Used   Counseling given: Not Answered   Continue to not smoke  Outpatient Encounter Medications as of 12/30/2020  Medication Sig  . [DISCONTINUED] pantoprazole (PROTONIX) 40 MG tablet Take 1 tablet (40 mg total) by mouth 2 (two) times daily. To reduce stomach acid (Patient not taking: Reported on 07/05/2019)   No facility-administered encounter medications on file as of 12/30/2020.     Review of Systems  Review of Systems  No chest pain with exertion.  No orthopnea or PND.  No lower extremity swelling.  Comprehensive review of systems otherwise negative.  Physical  Exam  BP 128/82   Pulse 83   Temp 97.6 F (36.4 C) (Temporal)   Ht 5\' 6"  (1.676 m)   Wt 227 lb 9.6 oz (103.2 kg)   SpO2 98% Comment: on RA  BMI 36.74 kg/m   Wt Readings from Last 5 Encounters:  12/30/20 227 lb 9.6 oz (103.2 kg)  06/14/19 207 lb (93.9 kg)  05/18/19 190 lb (86.2 kg)  03/09/19 197 lb (89.4 kg)  02/11/19 198 lb (89.8 kg)    BMI Readings from Last 5 Encounters:  12/30/20 36.74 kg/m  06/14/19 33.41 kg/m  05/18/19 30.67 kg/m  03/09/19 31.80 kg/m  02/11/19 31.96 kg/m     Physical Exam General: Well-appearing, no acute distress Eyes: EOMI, no icterus Neck: Supple, no JVP Mouth: Inflammation or erythema of posterior oropharynx, tonsils not well visualized but appear within normal limits, no tonsil stones noted Pulmonary: Clear to auscultation bilaterally, no wheezes Cardiovascular: Warm, no edema Abdomen: Nondistended, bowel sounds present MSK: No synovitis, joint effusion Neuro: Normal gait, no weakness Psych: Normal mood, full affect.   Assessment & Plan:   Post COVID-19 infection 2021: Several days of illness, cough, hemoptysis.  Gradually self resolved and improved.  No chest imaging around that time.  He denies any dyspnea exertion.  Occasional cough, foul-smelling sputum: Seen by dentist, told everything okay.  Unclear if this is halitosis.  Will obtain chest x-ray to make sure no insidious or chronic infection such as abscess.  Would expect her to be sicker.  Additionally, will order single contrast media esophagram to evaluate for potential esophageal diverticulum.  Suspect will need GI work-up which we can provide referral once results are back.   Return in about 3 months (around 03/31/2021).   06/01/2021, MD 12/30/2020

## 2020-12-30 NOTE — Patient Instructions (Addendum)
Nice to meet you  We will get a chest x-ray today, will provide directions to the office on [facility] for this to be performed.  I have ordered a barium swallow to look at your esophagus to make sure there is no abnormal outpouching that may be causing the foul smell.  If the chest x-ray does not show any reason for infection that may be causing the smell, I will refer you to our gastroenterology doctors for further evaluation.  Return to clinic in 3 months or sooner as needed with Dr. Judeth Horn

## 2021-01-10 ENCOUNTER — Ambulatory Visit
Admission: RE | Admit: 2021-01-10 | Discharge: 2021-01-10 | Disposition: A | Discharge status: Home / Self Care | Payer: No Typology Code available for payment source | Source: Ambulatory Visit | Attending: Pulmonary Disease | Admitting: Pulmonary Disease

## 2021-01-10 DIAGNOSIS — R059 Cough, unspecified: Secondary | ICD-10-CM

## 2021-01-23 ENCOUNTER — Other Ambulatory Visit: Payer: Self-pay | Admitting: Internal Medicine

## 2021-01-23 DIAGNOSIS — E049 Nontoxic goiter, unspecified: Secondary | ICD-10-CM

## 2021-02-03 ENCOUNTER — Ambulatory Visit
Admission: RE | Admit: 2021-02-03 | Discharge: 2021-02-03 | Disposition: A | Discharge status: Home / Self Care | Payer: No Typology Code available for payment source | Source: Ambulatory Visit | Attending: Internal Medicine | Admitting: Internal Medicine

## 2021-02-03 DIAGNOSIS — E049 Nontoxic goiter, unspecified: Secondary | ICD-10-CM

## 2021-03-10 ENCOUNTER — Other Ambulatory Visit: Payer: Self-pay | Admitting: Internal Medicine

## 2021-03-10 DIAGNOSIS — E049 Nontoxic goiter, unspecified: Secondary | ICD-10-CM

## 2021-03-10 DIAGNOSIS — E041 Nontoxic single thyroid nodule: Secondary | ICD-10-CM

## 2022-01-29 ENCOUNTER — Ambulatory Visit: Payer: No Typology Code available for payment source | Admitting: Family Medicine

## 2022-02-02 IMAGING — US US THYROID
1 series · 13 of 25 positions shown · non-contrast
Comparison: None.

CLINICAL DATA: Goiter.

EXAM:
THYROID ULTRASOUND
TECHNIQUE: Ultrasound examination of the thyroid gland and adjacent soft
tissues was performed.

[Series 1: us thyroid · 0.08mm/px · 13 of 53 slices shown]
[im 1/53]
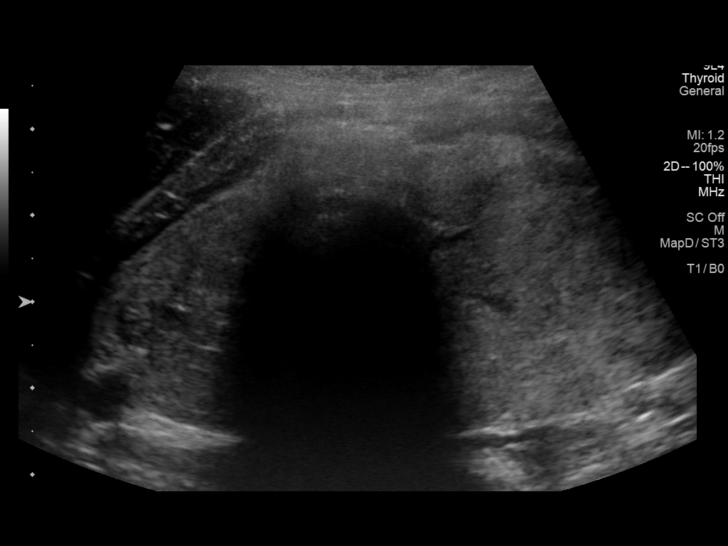
[im 5/53]
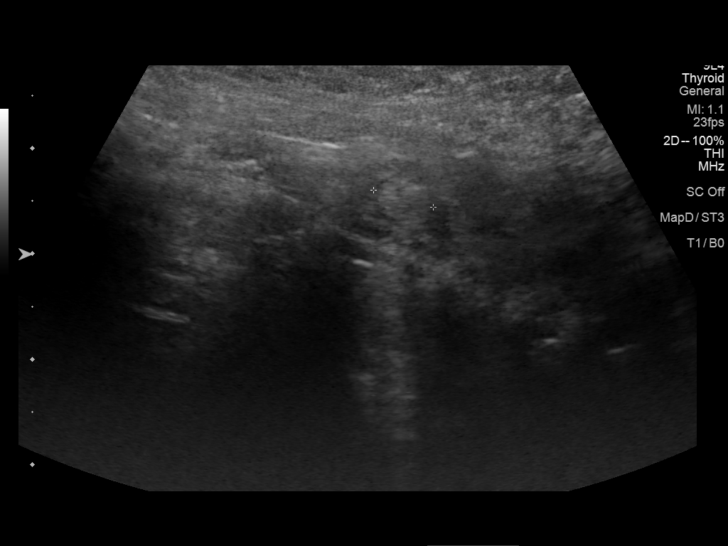
[im 9/53]
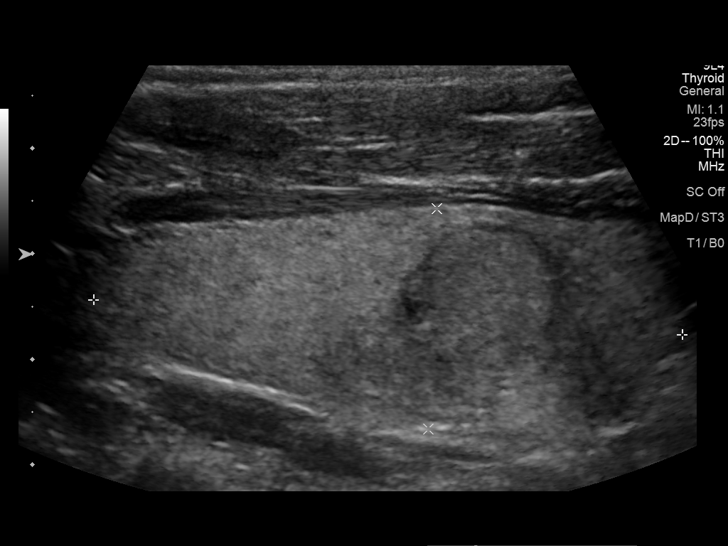
[im 14/53]
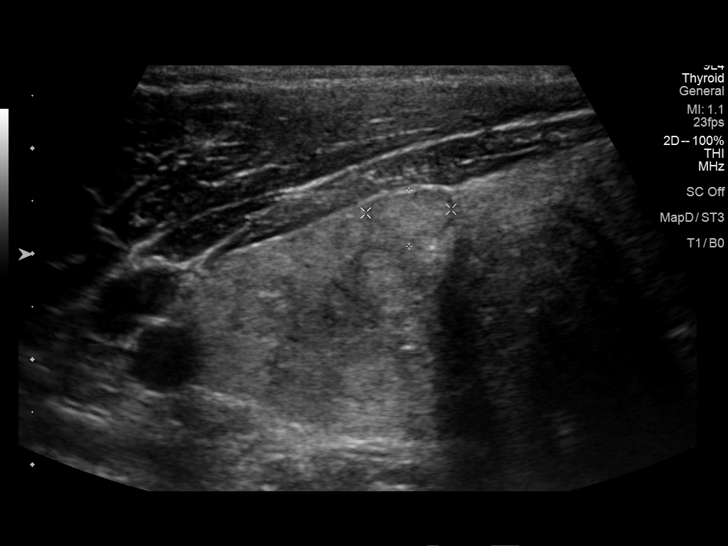
[im 18/53]
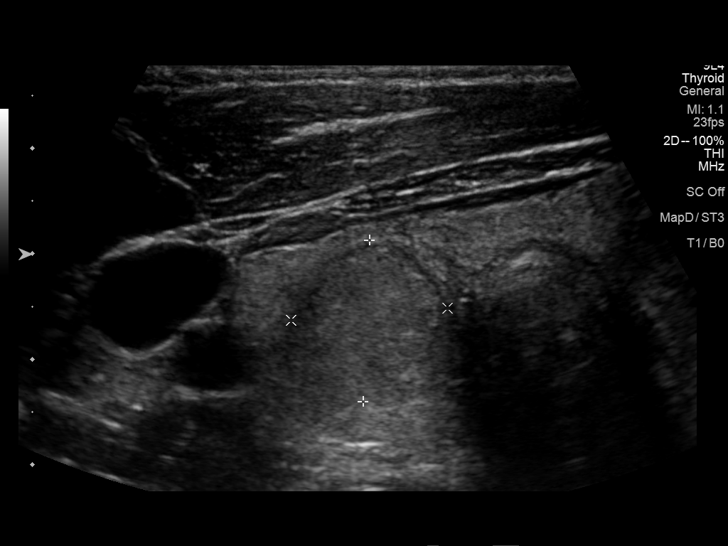
[im 22/53]
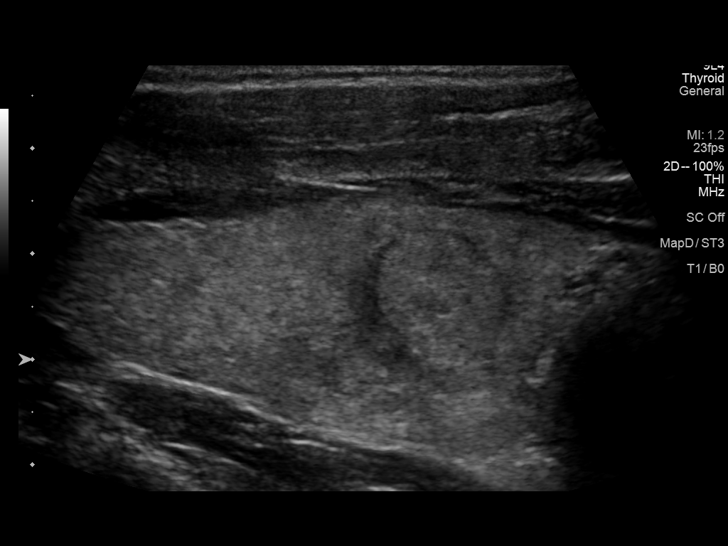
[im 27/53]
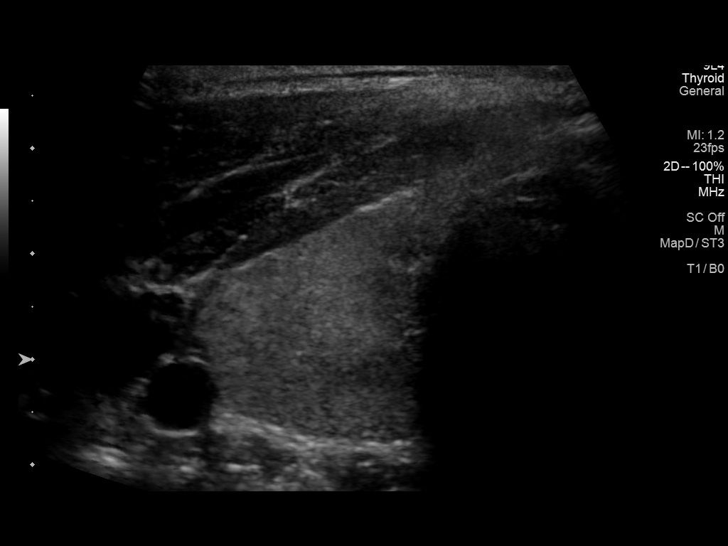
[im 31/53]
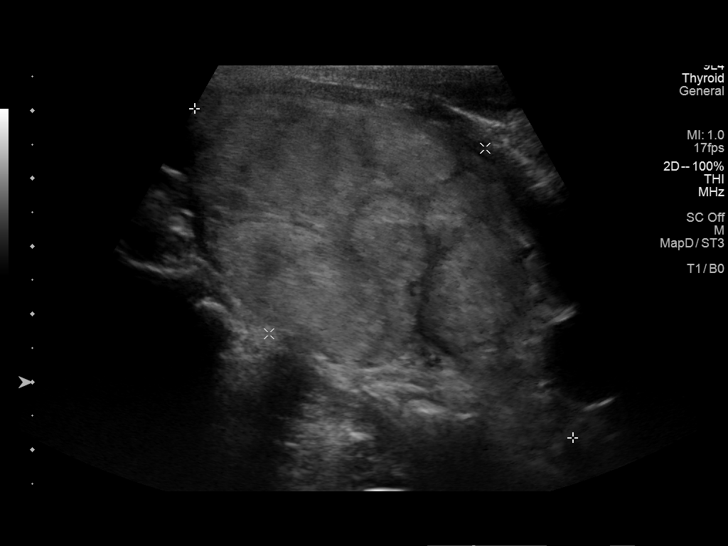
[im 35/53]
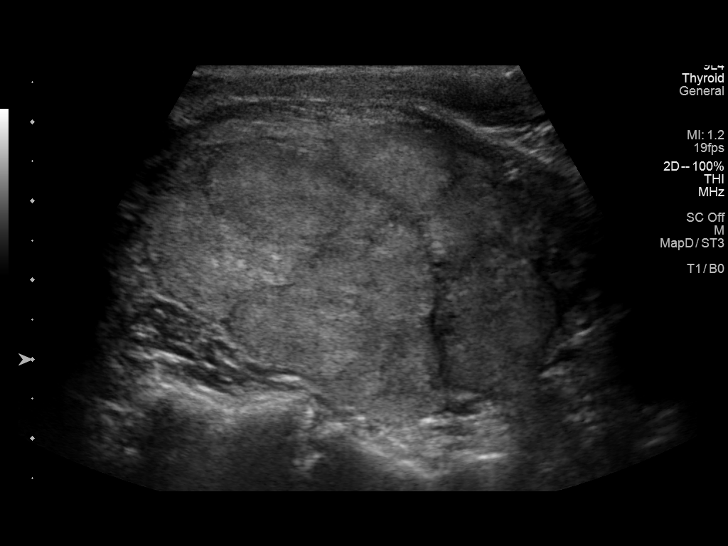
[im 40/53]
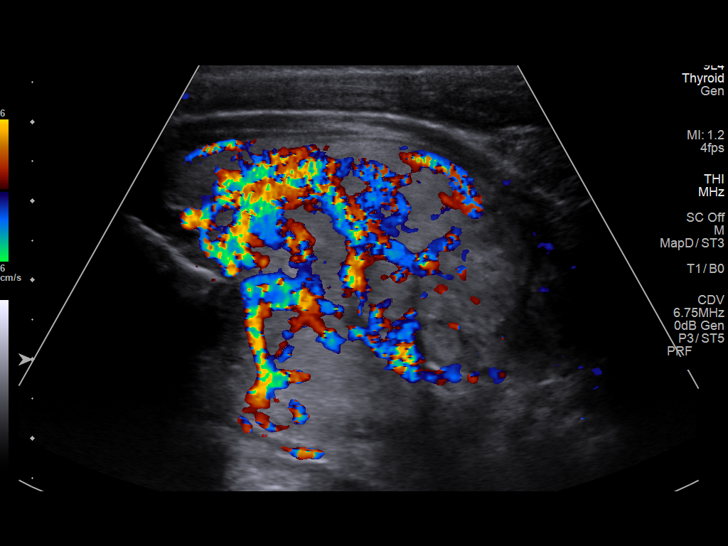
[im 44/53]
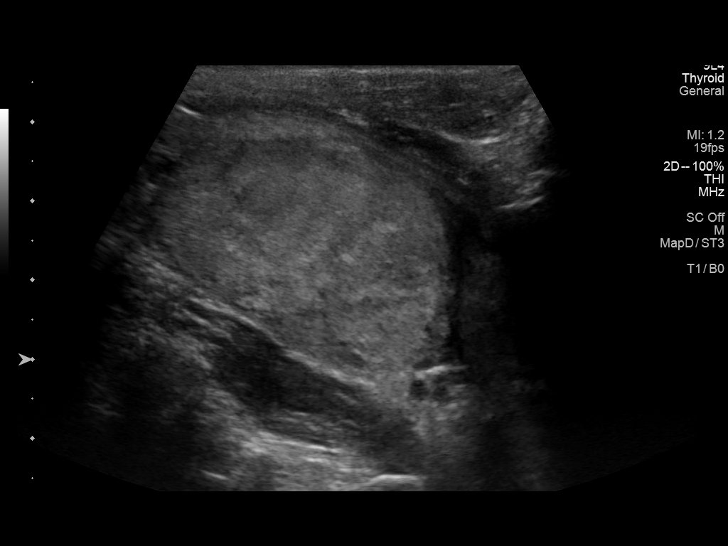
[im 48/53]
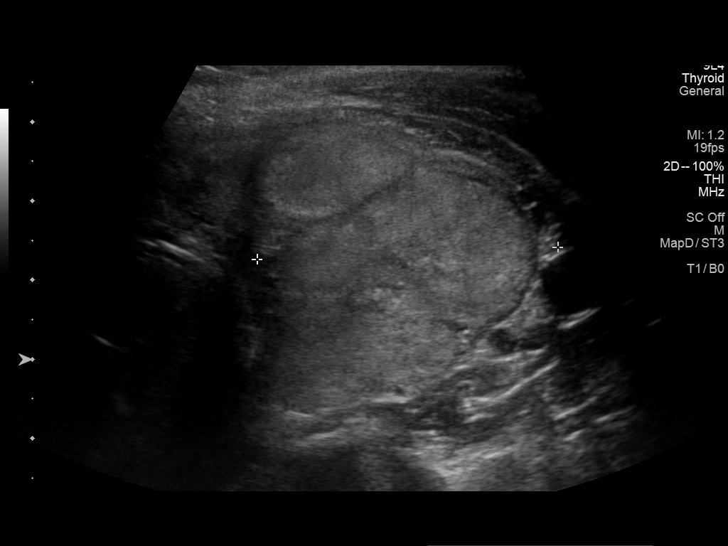
[im 53/53]
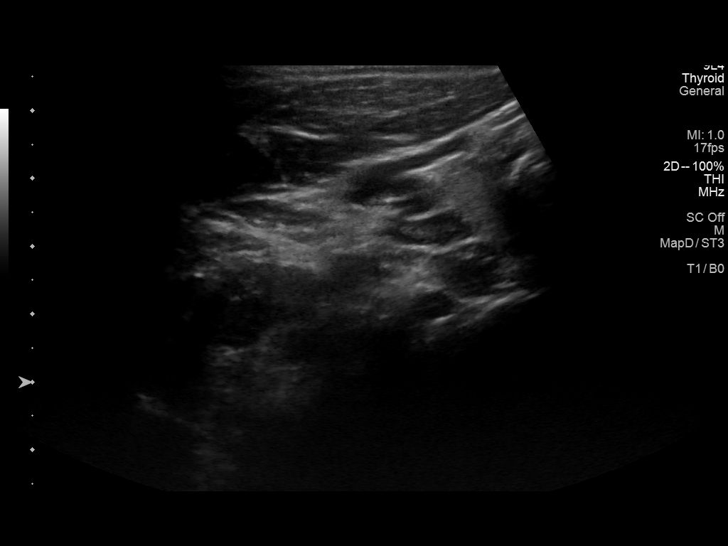

[13 of 25 positions shown; findings below may reference images not displayed]

FINDINGS: Parenchymal Echotexture: Mildly heterogenous

Isthmus: Normal in size measures 0.5 cm in diameter

Right lobe: Borderline enlarged measuring 5.6 x 2.1 x 2.0 cm

Left lobe: Enlarged measuring 7.4 x 4.2 x 3.8 cm

_________________________________________________________

Estimated total number of nodules >/= 1 cm: 2

Number of spongiform nodules >/=  2 cm not described below (TR1): 0

Number of mixed cystic and solid nodules >/= 1.5 cm not described
below (TR2):

_________________________________________________________

There is an approximately 0.7 cm isoechoic ill-defined
nodule/pseudonodule within the thyroid isthmus (labeled 1), which
does not meet criteria to recommend percutaneous sampling or
continued dedicated follow-up.

_________________________________________________________

There are 2 punctate (sub 0.8 cm) isoechoic ill-defined
nodules/pseudo nodules within the right lobe of the thyroid (labeled
2 and 3), neither of which meet imaging criteria to recommend
percutaneous sampling or continued dedicated follow-up.

_________________________________________________________

Nodule # 4:

Location: Right; Inferior

Maximum size: 1.5 cm; Other 2 dimensions: 1.5 x 1.5 cm

Composition: solid/almost completely solid (2)

Echogenicity: isoechoic (1)

Shape: not taller-than-wide (0)

Margins: ill-defined (0)

Echogenic foci: none (0)

ACR TI-RADS total points: 3.

ACR TI-RADS risk category: TR3 (3 points).

ACR TI-RADS recommendations:

*Given size (>/= 1.5 - 2.4 cm) and appearance, a follow-up
ultrasound in 1 year should be considered based on TI-RADS criteria.

_________________________________________________________

Nodule # 5:

Location: Left; Mid

Maximum size: 5.5 cm; Other 2 dimensions: 5.0 x 4.3 cm

Composition: solid/almost completely solid (2)

Echogenicity: isoechoic (1)

Shape: not taller-than-wide (0)

Margins: lobulated/irregular (2)

Echogenic foci: none (0)

ACR TI-RADS total points: 5.

ACR TI-RADS risk category: TR4 (4-6 points).

ACR TI-RADS recommendations:

**Given size (>/= 1.5 cm) and appearance, fine needle aspiration of
this moderately suspicious nodule should be considered based on
TI-RADS criteria.

_________________________________________________________
IMPRESSION: 1. Thyromegaly with findings suggestive of multinodular goiter.
2. Nodule #5 meets imaging criteria to recommend percutaneous
sampling as indicated.
3. Nodule #4 meets imaging criteria to recommend a 1 year follow-up.

The above is in keeping with the ACR TI-RADS recommendations - [HOSPITAL] 9274;[DATE].

## 2022-02-18 ENCOUNTER — Ambulatory Visit: Payer: No Typology Code available for payment source | Admitting: Family Medicine

## 2022-04-29 ENCOUNTER — Ambulatory Visit: Payer: No Typology Code available for payment source | Admitting: Family Medicine

## 2022-06-19 ENCOUNTER — Ambulatory Visit: Payer: No Typology Code available for payment source | Admitting: Family Medicine
